# Patient Record
Sex: Female | Born: 1995 | Race: Black or African American | Hispanic: No | Marital: Single | Smoking: Former smoker
Health system: Southern US, Community
[De-identification: ages and names within clinical notes are randomized; demographics above are authoritative.]

## PROBLEM LIST (undated history)

## (undated) ENCOUNTER — Inpatient Hospital Stay (HOSPITAL_COMMUNITY): Payer: Self-pay

## (undated) DIAGNOSIS — N926 Irregular menstruation, unspecified: Secondary | ICD-10-CM

## (undated) DIAGNOSIS — F909 Attention-deficit hyperactivity disorder, unspecified type: Secondary | ICD-10-CM

## (undated) DIAGNOSIS — I10 Essential (primary) hypertension: Secondary | ICD-10-CM

## (undated) DIAGNOSIS — G43909 Migraine, unspecified, not intractable, without status migrainosus: Secondary | ICD-10-CM

## (undated) DIAGNOSIS — F419 Anxiety disorder, unspecified: Secondary | ICD-10-CM

## (undated) DIAGNOSIS — K219 Gastro-esophageal reflux disease without esophagitis: Secondary | ICD-10-CM

## (undated) DIAGNOSIS — K5900 Constipation, unspecified: Secondary | ICD-10-CM

## (undated) DIAGNOSIS — F32A Depression, unspecified: Secondary | ICD-10-CM

## (undated) DIAGNOSIS — F812 Mathematics disorder: Secondary | ICD-10-CM

## (undated) DIAGNOSIS — F329 Major depressive disorder, single episode, unspecified: Secondary | ICD-10-CM

## (undated) DIAGNOSIS — J353 Hypertrophy of tonsils with hypertrophy of adenoids: Secondary | ICD-10-CM

## (undated) DIAGNOSIS — O24419 Gestational diabetes mellitus in pregnancy, unspecified control: Secondary | ICD-10-CM

## (undated) DIAGNOSIS — D649 Anemia, unspecified: Secondary | ICD-10-CM

## (undated) HISTORY — DX: Constipation, unspecified: K59.00

## (undated) HISTORY — DX: Attention-deficit hyperactivity disorder, unspecified type: F90.9

## (undated) HISTORY — DX: Depression, unspecified: F32.A

## (undated) HISTORY — DX: Irregular menstruation, unspecified: N92.6

## (undated) HISTORY — DX: Major depressive disorder, single episode, unspecified: F32.9

---
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
## 2016-01-07 LAB — OB RESULTS CONSOLE GBS: GBS: NEGATIVE

## 2016-01-16 ENCOUNTER — Encounter (HOSPITAL_COMMUNITY): Payer: Self-pay

## 2016-01-16 ENCOUNTER — Inpatient Hospital Stay (HOSPITAL_COMMUNITY): Payer: Medicaid Other

## 2016-01-16 ENCOUNTER — Inpatient Hospital Stay (HOSPITAL_COMMUNITY)
Admission: AD | Admit: 2016-01-16 | Discharge: 2016-01-21 | DRG: 765 | Disposition: A | Discharge status: Home / Self Care | Payer: Medicaid Other | Source: Ambulatory Visit | Attending: Obstetrics and Gynecology | Admitting: Obstetrics and Gynecology

## 2016-01-16 DIAGNOSIS — Z6841 Body Mass Index (BMI) 40.0 and over, adult: Secondary | ICD-10-CM | POA: Diagnosis not present

## 2016-01-16 DIAGNOSIS — D649 Anemia, unspecified: Secondary | ICD-10-CM | POA: Diagnosis present

## 2016-01-16 DIAGNOSIS — Z8249 Family history of ischemic heart disease and other diseases of the circulatory system: Secondary | ICD-10-CM

## 2016-01-16 DIAGNOSIS — O99214 Obesity complicating childbirth: Secondary | ICD-10-CM | POA: Diagnosis present

## 2016-01-16 DIAGNOSIS — F17210 Nicotine dependence, cigarettes, uncomplicated: Secondary | ICD-10-CM | POA: Diagnosis present

## 2016-01-16 DIAGNOSIS — O329XX0 Maternal care for malpresentation of fetus, unspecified, not applicable or unspecified: Secondary | ICD-10-CM

## 2016-01-16 DIAGNOSIS — Z9119 Patient's noncompliance with other medical treatment and regimen: Secondary | ICD-10-CM

## 2016-01-16 DIAGNOSIS — Z23 Encounter for immunization: Secondary | ICD-10-CM | POA: Diagnosis not present

## 2016-01-16 DIAGNOSIS — Z825 Family history of asthma and other chronic lower respiratory diseases: Secondary | ICD-10-CM | POA: Diagnosis not present

## 2016-01-16 DIAGNOSIS — O1413 Severe pre-eclampsia, third trimester: Secondary | ICD-10-CM

## 2016-01-16 DIAGNOSIS — O1414 Severe pre-eclampsia complicating childbirth: Principal | ICD-10-CM | POA: Diagnosis present

## 2016-01-16 DIAGNOSIS — O1410 Severe pre-eclampsia, unspecified trimester: Secondary | ICD-10-CM | POA: Diagnosis present

## 2016-01-16 DIAGNOSIS — O99334 Smoking (tobacco) complicating childbirth: Secondary | ICD-10-CM | POA: Diagnosis present

## 2016-01-16 DIAGNOSIS — Z3A37 37 weeks gestation of pregnancy: Secondary | ICD-10-CM | POA: Diagnosis not present

## 2016-01-16 DIAGNOSIS — O24424 Gestational diabetes mellitus in childbirth, insulin controlled: Secondary | ICD-10-CM | POA: Diagnosis present

## 2016-01-16 DIAGNOSIS — O9902 Anemia complicating childbirth: Secondary | ICD-10-CM | POA: Diagnosis present

## 2016-01-16 DIAGNOSIS — O24419 Gestational diabetes mellitus in pregnancy, unspecified control: Secondary | ICD-10-CM

## 2016-01-16 HISTORY — DX: Severe pre-eclampsia, unspecified trimester: O14.10

## 2016-01-16 LAB — URINALYSIS, ROUTINE W REFLEX MICROSCOPIC
Bilirubin Urine: NEGATIVE
GLUCOSE, UA: NEGATIVE mg/dL
KETONES UR: NEGATIVE mg/dL
NITRITE: NEGATIVE
PH: 6.5 (ref 5.0–8.0)
Protein, ur: >300 mg/dL — AB
SPECIFIC GRAVITY, URINE: 1.015 (ref 1.005–1.030)

## 2016-01-16 LAB — COMPREHENSIVE METABOLIC PANEL
ALBUMIN: 2.3 g/dL — AB (ref 3.5–5.0)
ALT: 17 U/L (ref 14–54)
AST: 22 U/L (ref 15–41)
Alkaline Phosphatase: 124 U/L (ref 38–126)
Anion gap: 8 (ref 5–15)
BUN: 8 mg/dL (ref 6–20)
CHLORIDE: 107 mmol/L (ref 101–111)
CO2: 18 mmol/L — AB (ref 22–32)
CREATININE: 0.65 mg/dL (ref 0.44–1.00)
Calcium: 8.9 mg/dL (ref 8.9–10.3)
GFR calc Af Amer: >60 mL/min (ref >60)
Glucose, Bld: 91 mg/dL (ref 65–99)
POTASSIUM: 4.5 mmol/L (ref 3.5–5.1)
SODIUM: 133 mmol/L — AB (ref 135–145)
Total Bilirubin: 0.4 mg/dL (ref 0.3–1.2)
Total Protein: 5.8 g/dL — ABNORMAL LOW (ref 6.5–8.1)

## 2016-01-16 LAB — GLUCOSE, CAPILLARY
GLUCOSE-CAPILLARY: 77 mg/dL (ref 65–99)
GLUCOSE-CAPILLARY: 99 mg/dL (ref 65–99)

## 2016-01-16 LAB — CBC
HCT: 29.2 % — ABNORMAL LOW (ref 36.0–46.0)
HEMATOCRIT: 28.9 % — AB (ref 36.0–46.0)
Hemoglobin: 9.6 g/dL — ABNORMAL LOW (ref 12.0–15.0)
Hemoglobin: 9.7 g/dL — ABNORMAL LOW (ref 12.0–15.0)
MCH: 25.5 pg — AB (ref 26.0–34.0)
MCH: 25.7 pg — ABNORMAL LOW (ref 26.0–34.0)
MCHC: 33.2 g/dL (ref 30.0–36.0)
MCHC: 33.2 g/dL (ref 30.0–36.0)
MCV: 76.9 fL — AB (ref 78.0–100.0)
MCV: 77.2 fL — ABNORMAL LOW (ref 78.0–100.0)
Platelets: 197 10*3/uL (ref 150–400)
Platelets: 200 10*3/uL (ref 150–400)
RBC: 3.76 MIL/uL — ABNORMAL LOW (ref 3.87–5.11)
RBC: 3.78 MIL/uL — ABNORMAL LOW (ref 3.87–5.11)
RDW: 15.8 % — AB (ref 11.5–15.5)
RDW: 16.1 % — ABNORMAL HIGH (ref 11.5–15.5)
WBC: 10.5 10*3/uL (ref 4.0–10.5)
WBC: 10.9 10*3/uL — ABNORMAL HIGH (ref 4.0–10.5)

## 2016-01-16 LAB — URINE MICROSCOPIC-ADD ON

## 2016-01-16 LAB — PROTEIN / CREATININE RATIO, URINE
Creatinine, Urine: 132 mg/dL
PROTEIN CREATININE RATIO: 12.64 mg/mg{creat} — AB (ref 0.00–0.15)
TOTAL PROTEIN, URINE: 1668 mg/dL

## 2016-01-16 MED ORDER — HYDRALAZINE HCL 20 MG/ML IJ SOLN
10.0000 mg | Freq: Once | INTRAMUSCULAR | Status: DC | PRN
  Filled 2016-01-16: qty 1

## 2016-01-16 MED ORDER — HYDRALAZINE HCL 20 MG/ML IJ SOLN
10.0000 mg | Freq: Once | INTRAMUSCULAR | Status: AC | PRN
  Administered 2016-01-18: 10 mg via INTRAVENOUS

## 2016-01-16 MED ORDER — LABETALOL HCL 5 MG/ML IV SOLN
20.0000 mg | INTRAVENOUS | Status: AC | PRN
  Administered 2016-01-16: 80 mg via INTRAVENOUS
  Administered 2016-01-16: 20 mg via INTRAVENOUS
  Administered 2016-01-16: 40 mg via INTRAVENOUS
  Filled 2016-01-16: qty 8
  Filled 2016-01-16: qty 4
  Filled 2016-01-16: qty 16

## 2016-01-16 MED ORDER — SOD CITRATE-CITRIC ACID 500-334 MG/5ML PO SOLN
30.0000 mL | ORAL | Status: DC | PRN
  Filled 2016-01-16: qty 15

## 2016-01-16 MED ORDER — TERBUTALINE SULFATE 1 MG/ML IJ SOLN: 0.2500 mg | Freq: Once | INTRAMUSCULAR | Status: DC | PRN

## 2016-01-16 MED ORDER — OXYTOCIN 40 UNITS IN LACTATED RINGERS INFUSION - SIMPLE MED: 2.5000 [IU]/h | INTRAVENOUS | Status: DC

## 2016-01-16 MED ORDER — LACTATED RINGERS IV SOLN
INTRAVENOUS | Status: DC
  Administered 2016-01-16: 18:00:00 via INTRAVENOUS

## 2016-01-16 MED ORDER — OXYCODONE-ACETAMINOPHEN 5-325 MG PO TABS: 2.0000 | ORAL_TABLET | ORAL | Status: DC | PRN

## 2016-01-16 MED ORDER — ACETAMINOPHEN 325 MG PO TABS
650.0000 mg | ORAL_TABLET | ORAL | Status: DC | PRN
  Administered 2016-01-17 – 2016-01-18 (×4): 650 mg via ORAL
  Filled 2016-01-16 (×4): qty 2

## 2016-01-16 MED ORDER — FLEET ENEMA 7-19 GM/118ML RE ENEM: 1.0000 | ENEMA | RECTAL | Status: DC | PRN

## 2016-01-16 MED ORDER — ONDANSETRON HCL 4 MG/2ML IJ SOLN
4.0000 mg | Freq: Four times a day (QID) | INTRAMUSCULAR | Status: DC | PRN
Start: 2016-01-16 — End: 2016-01-19

## 2016-01-16 MED ORDER — MAGNESIUM SULFATE BOLUS VIA INFUSION
4.0000 g | Freq: Once | INTRAVENOUS | Status: AC
  Administered 2016-01-16: 4 g via INTRAVENOUS
  Filled 2016-01-16: qty 500

## 2016-01-16 MED ORDER — LACTATED RINGERS IV SOLN
2.0000 g/h | INTRAVENOUS | Status: AC
  Administered 2016-01-16 – 2016-01-19 (×4): 2 g/h via INTRAVENOUS
  Filled 2016-01-16 (×4): qty 80

## 2016-01-16 MED ORDER — MISOPROSTOL 25 MCG QUARTER TABLET
25.0000 ug | ORAL_TABLET | ORAL | Status: DC | PRN
  Administered 2016-01-16 – 2016-01-17 (×3): 25 ug via VAGINAL
  Filled 2016-01-16 (×3): qty 0.25

## 2016-01-16 MED ORDER — ACETAMINOPHEN 325 MG PO TABS
650.0000 mg | ORAL_TABLET | Freq: Once | ORAL | Status: AC
  Administered 2016-01-16: 650 mg via ORAL
  Filled 2016-01-16: qty 2

## 2016-01-16 MED ORDER — LACTATED RINGERS IV SOLN
INTRAVENOUS | Status: DC
  Administered 2016-01-16 – 2016-01-18 (×5): via INTRAVENOUS

## 2016-01-16 MED ORDER — OXYTOCIN BOLUS FROM INFUSION: 500.0000 mL | INTRAVENOUS | Status: DC

## 2016-01-16 MED ORDER — OXYCODONE-ACETAMINOPHEN 5-325 MG PO TABS
1.0000 | ORAL_TABLET | ORAL | Status: DC | PRN
  Administered 2016-01-19: 1 via ORAL
  Filled 2016-01-16: qty 1

## 2016-01-16 MED ORDER — LIDOCAINE HCL (PF) 1 % IJ SOLN: 30.0000 mL | INTRAMUSCULAR | Status: DC | PRN

## 2016-01-16 MED ORDER — LACTATED RINGERS IV SOLN: 500.0000 mL | INTRAVENOUS | Status: DC | PRN

## 2016-01-16 MED ORDER — ZOLPIDEM TARTRATE 5 MG PO TABS: 5.0000 mg | ORAL_TABLET | Freq: Every evening | ORAL | Status: DC | PRN

## 2016-01-16 MED ORDER — LABETALOL HCL 5 MG/ML IV SOLN
20.0000 mg | INTRAVENOUS | Status: DC | PRN
  Administered 2016-01-18: 20 mg via INTRAVENOUS
  Filled 2016-01-16: qty 4

## 2016-01-16 MED ORDER — FENTANYL CITRATE (PF) 100 MCG/2ML IJ SOLN
50.0000 ug | INTRAMUSCULAR | Status: DC | PRN
  Administered 2016-01-17 – 2016-01-18 (×4): 100 ug via INTRAVENOUS
  Filled 2016-01-16 (×5): qty 2

## 2016-01-16 MED ORDER — INSULIN ASPART 100 UNIT/ML ~~LOC~~ SOLN: 0.0000 [IU] | SUBCUTANEOUS | Status: DC

## 2016-01-16 NOTE — MAU Note (Signed)
CANNOT  TAKE  PT  AT THIS   TIME.

## 2016-01-16 NOTE — MAU Note (Signed)
Pt reports she was called today from her doctors office and told her urine test said she had peclampsia  And her elevated b/p on Wed. Pt c/o of slight headache, decreased fetal movement and swelling.

## 2016-01-16 NOTE — Anesthesia Pain Management Evaluation Note (Signed)
  [facility] Pain Management Visit Note  Patient: Ashley Romero, 20 y.o., female  "Hello I am a member of the anesthesia team at [facility]. We have an anesthesia team available at all times to provide care throughout the hospital, including epidural management and anesthesia for C-section. I don't know your plan for the delivery whether it a natural birth, water birth, IV sedation, nitrous supplementation, doula or epidural, but we want to meet your pain goals."   1.Was your pain managed to your expectations on prior hospitalizations?   No prior hospitalizations  2.What is your expectation for pain management during this hospitalization?     Epidural and Nitrous Oxide  3.How can we help you reach that goal? Nirtous oxide, epidural if nitrous isnt enough  Record the patient's initial score and the patient's pain goal.   Pain: 0  Pain Goal: 5 [facility] wants you to be able to say your pain was always managed very well.  Mallorie Norrod 01/16/2016

## 2016-01-16 NOTE — H&P (Signed)
Ashley Romero is a 20 y.o. female G1P0 @ [redacted]w[redacted]d presenting for Preeclampsia with severe features; specifically -headache. B/P's in severe range controlled with one dose of Labetolol in MAU.  Pregnancy followed at [facility] since [redacted]w[redacted]d  weeks and remarkable for: Preeclampsia w/ severe features; GDM-Insulin required- noncompliant. Morbid obesity   Growth u/s on 01/07/16 EFW 7lbs 8oz 96%tile AC- 98%tile. U/S done tonight with an EFW of 7 lbs.  Discussed possibility that she could have shoulder dystocia because her diabetes has not been controlled.  PCR- 12.64   Results for orders placed or performed during the hospital encounter of 01/16/16 (from the past 24 hour(s))  Urinalysis, Routine w reflex microscopic (not at [facility])     Status: Abnormal   Collection Time: 01/16/16  5:33 PM  Result Value Ref Range   Color, Urine YELLOW YELLOW   APPearance HAZY (A) CLEAR   Specific Gravity, Urine 1.015 1.005 - 1.030   pH 6.5 5.0 - 8.0   Glucose, UA NEGATIVE NEGATIVE mg/dL   Hgb urine dipstick MODERATE (A) NEGATIVE   Bilirubin Urine NEGATIVE NEGATIVE   Ketones, ur NEGATIVE NEGATIVE mg/dL   Protein, ur >161 (A) NEGATIVE mg/dL   Nitrite NEGATIVE NEGATIVE   Leukocytes, UA TRACE (A) NEGATIVE  Protein / creatinine ratio, urine     Status: Abnormal   Collection Time: 01/16/16  5:33 PM  Result Value Ref Range   Creatinine, Urine 132.00 mg/dL   Total Protein, Urine 1668 mg/dL   Protein Creatinine Ratio 12.64 (H) 0.00 - 0.15 mg/mg[Cre]  Urine microscopic-add on     Status: Abnormal   Collection Time: 01/16/16  5:33 PM  Result Value Ref Range   Squamous Epithelial / LPF 0-5 (A) NONE SEEN   WBC, UA 6-30 0 - 5 WBC/hpf   RBC / HPF 0-5 0 - 5 RBC/hpf   Bacteria, UA RARE (A) NONE SEEN  Comprehensive metabolic panel     Status: Abnormal   Collection Time: 01/16/16  6:19 PM  Result Value Ref Range   Sodium 133 (L) 135 - 145 mmol/L   Potassium 4.5 3.5 - 5.1 mmol/L   Chloride 107 101 - 111 mmol/L   CO2 18 (L) 22 -  32 mmol/L   Glucose, Bld 91 65 - 99 mg/dL   BUN 8 6 - 20 mg/dL   Creatinine, Ser 0.96 0.44 - 1.00 mg/dL   Calcium 8.9 8.9 - 04.5 mg/dL   Total Protein 5.8 (L) 6.5 - 8.1 g/dL   Albumin 2.3 (L) 3.5 - 5.0 g/dL   AST 22 15 - 41 U/L   ALT 17 14 - 54 U/L   Alkaline Phosphatase 124 38 - 126 U/L   Total Bilirubin 0.4 0.3 - 1.2 mg/dL   GFR calc non Af Amer >60 >60 mL/min   GFR calc Af Amer >60 >60 mL/min   Anion gap 8 5 - 15  CBC     Status: Abnormal   Collection Time: 01/16/16  6:19 PM  Result Value Ref Range   WBC 10.5 4.0 - 10.5 K/uL   RBC 3.76 (L) 3.87 - 5.11 MIL/uL   Hemoglobin 9.6 (L) 12.0 - 15.0 g/dL   HCT 40.9 (L) 81.1 - 91.4 %   MCV 76.9 (L) 78.0 - 100.0 fL   MCH 25.5 (L) 26.0 - 34.0 pg   MCHC 33.2 30.0 - 36.0 g/dL   RDW 78.2 (H) 95.6 - 21.3 %   Platelets 197 150 - 400 K/uL  Type and screen  Status: None   Collection Time: 01/16/16  6:19 PM  Result Value Ref Range   ABO/RH(D) O POS    Antibody Screen NEG    Sample Expiration 01/19/2016   Glucose, capillary     Status: None   Collection Time: 01/16/16  8:37 PM  Result Value Ref Range   Glucose-Capillary 77 65 - 99 mg/dL    OB History    Gravida Para Term Preterm AB TAB SAB Ectopic Multiple Living   1              Past Medical History  Diagnosis Date  . ADHD (attention deficit hyperactivity disorder)   . Depression   . Constipation   . Irregular menses     Nexplanon implant  . Migraines   . Learning disorder involving mathematics   . GERD (gastroesophageal reflux disease)     OTC as needed  . Hypertrophy of tonsils and adenoids 08/2013    snores during sleep; had sleep study 03/2013:  AHI 1.9, RDI 2.3  . Anxiety   . Gestational diabetes    Past Surgical History  Procedure Laterality Date  . Tonsillectomy and adenoidectomy Bilateral 09/25/2013    Procedure: BILATERAL TONSILLECTOMY AND ADENOIDECTOMY;  Surgeon: Darletta MollSui W Teoh, MD;  Location: [facility];  Service: ENT;  Laterality: Bilateral;  .  Wisdom tooth extraction  2015    Family History:   family history includes ADD / ADHD in her cousin; Anesthesia problems in her mother; Anxiety disorder in her mother; Asthma in her mother; Bipolar disorder in her maternal aunt; Depression in her maternal aunt and mother; Heart disease in her maternal grandmother; Hypertension in her mother; Migraines in her maternal grandmother and mother; Schizophrenia in her maternal aunt. Social History:    reports that she has quit smoking. Her smoking use included Cigarettes. She smoked 0.25 packs per day. She has never used smokeless tobacco. She reports that she does not drink alcohol or use illicit drugs.   Prenatal labs: ABO, Rh: --/--/O POS (07/20 1819) Antibody: NEG (07/20 1819) Rubella: !Error! RPR: Nonreactive (01/12 0000)  HBsAg: Negative (01/12 0000)  HIV: Non-reactive (01/12 0000)  GBS:   Negative   Prenatal Transfer Tool  Maternal Diabetes: Yes:  Diabetes Type:  Insulin/Medication controlled Genetic Screening: Normal Maternal Ultrasounds/Referrals: Normal Fetal Ultrasounds or other Referrals:  Referred to Materal Fetal Medicine  Maternal Substance Abuse:  No Significant Maternal Medications:  Meds include: Other:  Significant Maternal Lab Results: Lab values include: Other: PCR 12     Blood pressure 159/114, pulse 96, temperature 98 F (36.7 C), temperature source Oral, resp. rate 18, height 5\' 1"  (1.549 m), weight 312 lb 1.9 oz (141.577 kg), last menstrual period 05/12/2015.  General Appearance: Alert, appropriate appearance for age. No acute distress HEENT Exam: Grossly normal Chest/Respiratory Exam: Normal chest wall and respirations. Clear to auscultation Cardiovascular Exam: Regular rate and rhythm. S1, S2, no murmur Gastrointestinal Exam: soft, non-tender, Uterus gravid with size compatible with GA, Vertex presentation by Leopold's maneuvers Psychiatric Exam: Alert and oriented, appropriate affect Reflexes  2+  ++++++++++++++++++++++++++++++++++++++++++++++++++++++++++++++++  Vaginal exam: closed; confirmed vtx with u/s  Contractions irregular; mild  Fetal tracings: Category 1 . Baseline 145  ++++++++++++++++++++++++++++++++++++++++++++++++++++++++++++++++   Assessment/Plan: A1- IUP @ 37+1 A2-Primagravida A3-Morbid Obesity A4- GDM-Insulin- Non compliant; Not controlled A5- Severe Preeclampsia  P1-Admit to L/D for medical induction of labor due to Preeclampsia with severe features P2-Cytotec       Magnesium Sulfate  Labetolol Protocol       CBG's q 4 hours; Sliding scale insulin if needed       Anticipate Vaginal Delivery      Illene Bolus CNM  01/16/2016, 9:50 PM

## 2016-01-16 NOTE — MAU Provider Note (Signed)
Chief Complaint:  Hypertension   First Provider Initiated Contact with Patient 01/16/16 1759     HPI: Ashley Romero is a 20 y.o. G1P0 at [redacted]w[redacted]d who presents to maternity admissions or preeclampsia evaluation. Elevated protein creatinine ratio in the office, but normal blood pressures at regular prenatal visit yesterday. Of note patient had mildly elevated blood pressure of 140/80 at 35 weeks.  Location: Headache Quality: Pressure Severity: 5/10 in pain scale Duration: 2 days Timing: constant Modifying factors: improves w/ sleep Associated signs and symptoms: Pos for seeing black spots.   Denies contractions, leakage of fluid or vaginal bleeding. Good fetal movement.   Past Medical History: Past Medical History  Diagnosis Date  . ADHD (attention deficit hyperactivity disorder)   . Depression   . Constipation   . Irregular menses     On Nexplanon implant  . Migraines   . Learning disorder involving mathematics   . GERD (gastroesophageal reflux disease)     OTC as needed  . Hypertrophy of tonsils and adenoids 08/2013    snores during sleep; had sleep study 03/2013:  AHI 1.9, RDI 2.3  . Anxiety   . Gestational diabetes-A2(insulin)      Past obstetric history: OB History  Gravida Para Term Preterm AB SAB TAB Ectopic Multiple Living  1             # Outcome Date GA Lbr Len/2nd Weight Sex Delivery Anes PTL Lv  1 Current               Past Surgical History: Past Surgical History  Procedure Laterality Date  . Tonsillectomy and adenoidectomy Bilateral 09/25/2013    Procedure: BILATERAL TONSILLECTOMY AND ADENOIDECTOMY;  Surgeon: Darletta Moll, MD;  Location: [facility];  Service: ENT;  Laterality: Bilateral;  . Wisdom tooth extraction  2015     Family History: Family History  Problem Relation Age of Onset  . Asthma Mother   . Hypertension Mother   . Anesthesia problems Mother     hard to wake up post-op  . Migraines Mother   . Depression Mother   .  Anxiety disorder Mother   . Heart disease Maternal Grandmother   . Migraines Maternal Grandmother   . Bipolar disorder Maternal Aunt   . Schizophrenia Maternal Aunt   . Depression Maternal Aunt   . ADD / ADHD Cousin     Many paternal 1 st cousins have ADD/ADHD    Social History: Social History  Substance Use Topics  . Smoking status: Light Tobacco Smoker -- 0.25 packs/day    Types: Cigarettes  . Smokeless tobacco: Never Used     Comment: outside smokers at home  . Alcohol Use: No    Allergies:  Allergies  Allergen Reactions  . Other     Seasonal Allergies    Meds:  Prescriptions prior to admission  Medication Sig Dispense Refill Last Dose  . polyethylene glycol (MIRALAX / GLYCOLAX) packet Mix one packet in 8 oz of water and drink once daily prn constipation (Patient taking differently: Take 17 g by mouth daily as needed for moderate constipation. Mix one packet in 8 oz of water and drink once daily prn constipation) 30 each 2 11/20/2015 at Unknown time  . Prenatal Vit-Fe Fumarate-FA (PRENATAL VITAMIN) 27-0.8 MG TABS Take 1 tablet by mouth daily. 30 tablet 12 11/21/2015 at Unknown time    I have reviewed patient's Past Medical Hx, Surgical Hx, Family Hx, Social Hx, medications and allergies.  ROS:  Review of Systems  Constitutional: Negative for fever and chills.  Eyes: Positive for visual disturbance.  Gastrointestinal: Negative for abdominal pain.  Genitourinary: Negative for vaginal bleeding.  Neurological: Positive for headaches.    Physical Exam  Patient Vitals for the past 24 hrs:  BP Temp Temp src Pulse Resp Height Weight  01/16/16 1847 154/97 mmHg - - 88 - - -  01/16/16 1843 147/75 mmHg - - 87 - - -  01/16/16 1833 161/93 mmHg - - 87 - - -  01/16/16 1823 155/89 mmHg - - 95 - - -  01/16/16 1802 (!) 161/112 mmHg - - 94 - - -  01/16/16 1800 148/89 mmHg - - 93 - - -  01/16/16 1739 (!) 135/112 mmHg 98.6 F (37 C) Oral 91 18 5\' 1"  (1.549 m) (!) 312 lb 1.9 oz  (141.577 kg)   Constitutional: Well-developed, well-nourished, morbidly obese female in no acute distress.  Cardiovascular: normal rate Respiratory: normal effort GI: Abd soft, non-tender, gravid appropriate for gestational age.  MS: Extremities nontender, 3+ edema, normal ROM Neurologic: Alert and oriented x 4. 1+reflexes, 1 beat clonus bilat GU: Deferred    FHT:  Baseline 155 , min-moderate variability, accelerations present, no decelerations Contractions: irreg, mild   Labs: Results for orders placed or performed during the hospital encounter of 01/16/16 (from the past 24 hour(s))  Urinalysis, Routine w reflex microscopic (not [facility])     Status: Abnormal   Collection Time: 01/16/16  5:33 PM  Result Value Ref Range   Color, Urine YELLOW YELLOW   APPearance HAZY (A) CLEAR   Specific Gravity, Urine 1.015 1.005 - 1.030   pH 6.5 5.0 - 8.0   Glucose, UA NEGATIVE NEGATIVE mg/dL   Hgb urine dipstick MODERATE (A) NEGATIVE   Bilirubin Urine NEGATIVE NEGATIVE   Ketones, ur NEGATIVE NEGATIVE mg/dL   Protein, ur >960>300 (A) NEGATIVE mg/dL   Nitrite NEGATIVE NEGATIVE   Leukocytes, UA TRACE (A) NEGATIVE  Protein / creatinine ratio, urine     Status: Abnormal   Collection Time: 01/16/16  5:33 PM  Result Value Ref Range   Creatinine, Urine 132.00 mg/dL   Total Protein, Urine 1668 mg/dL   Protein Creatinine Ratio 12.64 (H) 0.00 - 0.15 mg/mg[Cre]  Urine microscopic-add on     Status: Abnormal   Collection Time: 01/16/16  5:33 PM  Result Value Ref Range   Squamous Epithelial / LPF 0-5 (A) NONE SEEN   WBC, UA 6-30 0 - 5 WBC/hpf   RBC / HPF 0-5 0 - 5 RBC/hpf   Bacteria, UA RARE (A) NONE SEEN  Comprehensive metabolic panel     Status: Abnormal   Collection Time: 01/16/16  6:19 PM  Result Value Ref Range   Sodium 133 (L) 135 - 145 mmol/L   Potassium 4.5 3.5 - 5.1 mmol/L   Chloride 107 101 - 111 mmol/L   CO2 18 (L) 22 - 32 mmol/L   Glucose, Bld 91 65 - 99 mg/dL   BUN 8 6 - 20 mg/dL    Creatinine, Ser 4.540.65 0.44 - 1.00 mg/dL   Calcium 8.9 8.9 - 09.810.3 mg/dL   Total Protein 5.8 (L) 6.5 - 8.1 g/dL   Albumin 2.3 (L) 3.5 - 5.0 g/dL   AST 22 15 - 41 U/L   ALT 17 14 - 54 U/L   Alkaline Phosphatase 124 38 - 126 U/L   Total Bilirubin 0.4 0.3 - 1.2 mg/dL   GFR calc non Af Amer >  60 >60 mL/min   GFR calc Af Amer >60 >60 mL/min   Anion gap 8 5 - 15  CBC     Status: Abnormal   Collection Time: 01/16/16  6:19 PM  Result Value Ref Range   WBC 10.5 4.0 - 10.5 K/uL   RBC 3.76 (L) 3.87 - 5.11 MIL/uL   Hemoglobin 9.6 (L) 12.0 - 15.0 g/dL   HCT 40.9 (L) 81.1 - 91.4 %   MCV 76.9 (L) 78.0 - 100.0 fL   MCH 25.5 (L) 26.0 - 34.0 pg   MCHC 33.2 30.0 - 36.0 g/dL   RDW 78.2 (H) 95.6 - 21.3 %   Platelets 197 150 - 400 K/uL  Type and screen     Status: None   Collection Time: 01/16/16  6:19 PM  Result Value Ref Range   ABO/RH(D) O POS    Antibody Screen NEG    Sample Expiration 01/19/2016     Imaging:  No results found.  MAU Course: Orders Placed This Encounter  Procedures  . Urinalysis, Routine w reflex microscopic (not at [facility])  . Protein / creatinine ratio, urine  . Comprehensive metabolic panel  . CBC  . RPR  . Urine microscopic-add on  . Check blood pressure 20 minutes after giving hydrALAZINE 10 mg IV dose. Call MD if SBP >/= 160 and/or DBP >/= 110.  . Once BP goal is reached, repeat BP every 10 minutes for 1 hour, then every 15 minutes for 1 hours, then per policy for antepartum labor or post-partum.  . Vital signs  . Type and screen  . ABO/Rh  . Insert peripheral IV  IV labetalol 20 mg given  MDM: - Meets criteria for preeclampsia w/ severe features based on headache, vision changes, severe-range blood pressures.  Assessment: 1. Preeclampsia, severe, third trimester    Plan: Admit to L&D for induction of labor per Dr. Estanislado Pandy.  [place], [place] 01/16/2016 7:32 PM

## 2016-01-16 NOTE — Progress Notes (Signed)
In to introduce myself to pt and evaluate. Pt comfortable and looks well. Morbidly obese with large edematous pannus.  Leopolds limited due to habitus but suspect 7 1/2-8 lbs. Ultrasound at bedside and ultrasound shows vertex infant, 6 lbs 14 oz. Discussed risk of shoulder dystocia due to uncontrolled diabetes.  Pt previous counseled on elective cesarean section if EFW 4000 gram at time of delivery.

## 2016-01-17 LAB — URIC ACID: Uric Acid, Serum: 5.9 mg/dL (ref 2.3–6.6)

## 2016-01-17 LAB — COMPREHENSIVE METABOLIC PANEL
ALK PHOS: 131 U/L — AB (ref 38–126)
ALT: 14 U/L (ref 14–54)
ALT: 19 U/L (ref 14–54)
AST: 22 U/L (ref 15–41)
AST: 25 U/L (ref 15–41)
Albumin: 2.4 g/dL — ABNORMAL LOW (ref 3.5–5.0)
Albumin: 2.6 g/dL — ABNORMAL LOW (ref 3.5–5.0)
Alkaline Phosphatase: 122 U/L (ref 38–126)
Anion gap: 8 (ref 5–15)
Anion gap: 9 (ref 5–15)
BILIRUBIN TOTAL: 0.4 mg/dL (ref 0.3–1.2)
BUN: 7 mg/dL (ref 6–20)
BUN: 9 mg/dL (ref 6–20)
CALCIUM: 8.3 mg/dL — AB (ref 8.9–10.3)
CO2: 20 mmol/L — AB (ref 22–32)
CO2: 20 mmol/L — ABNORMAL LOW (ref 22–32)
CREATININE: 0.7 mg/dL (ref 0.44–1.00)
Calcium: 8.8 mg/dL — ABNORMAL LOW (ref 8.9–10.3)
Chloride: 107 mmol/L (ref 101–111)
Chloride: 108 mmol/L (ref 101–111)
Creatinine, Ser: 0.98 mg/dL (ref 0.44–1.00)
GFR calc Af Amer: >60 mL/min (ref >60)
GFR calc non Af Amer: >60 mL/min (ref >60)
Glucose, Bld: 108 mg/dL — ABNORMAL HIGH (ref 65–99)
Glucose, Bld: 86 mg/dL (ref 65–99)
Potassium: 4.1 mmol/L (ref 3.5–5.1)
Potassium: 4.4 mmol/L (ref 3.5–5.1)
SODIUM: 136 mmol/L (ref 135–145)
Sodium: 136 mmol/L (ref 135–145)
TOTAL PROTEIN: 6.6 g/dL (ref 6.5–8.1)
Total Bilirubin: 0.3 mg/dL (ref 0.3–1.2)
Total Protein: 6.3 g/dL — ABNORMAL LOW (ref 6.5–8.1)

## 2016-01-17 LAB — GLUCOSE, CAPILLARY
GLUCOSE-CAPILLARY: 87 mg/dL (ref 65–99)
Glucose-Capillary: 99 mg/dL (ref 65–99)
Glucose-Capillary: 93 mg/dL (ref 65–99)
Glucose-Capillary: 100 mg/dL — ABNORMAL HIGH (ref 65–99)
Glucose-Capillary: 81 mg/dL (ref 65–99)

## 2016-01-17 LAB — RPR: RPR: NONREACTIVE

## 2016-01-17 LAB — CBC
HEMATOCRIT: 32.1 % — AB (ref 36.0–46.0)
HEMOGLOBIN: 10.6 g/dL — AB (ref 12.0–15.0)
MCH: 25.5 pg — AB (ref 26.0–34.0)
MCHC: 33 g/dL (ref 30.0–36.0)
MCV: 77.3 fL — AB (ref 78.0–100.0)
Platelets: 199 10*3/uL (ref 150–400)
RBC: 4.15 MIL/uL (ref 3.87–5.11)
RDW: 16.2 % — ABNORMAL HIGH (ref 11.5–15.5)
WBC: 12.2 10*3/uL — ABNORMAL HIGH (ref 4.0–10.5)

## 2016-01-17 LAB — TYPE AND SCREEN
ABO/RH(D): O POS
ABO/RH(D): O POS
ANTIBODY SCREEN: NEGATIVE
Antibody Screen: NEGATIVE

## 2016-01-17 LAB — ABO/RH: ABO/RH(D): O POS

## 2016-01-17 LAB — LACTATE DEHYDROGENASE: LDH: 202 U/L — AB (ref 98–192)

## 2016-01-17 MED ORDER — TERBUTALINE SULFATE 1 MG/ML IJ SOLN: 0.2500 mg | Freq: Once | INTRAMUSCULAR | Status: DC | PRN

## 2016-01-17 MED ORDER — OXYTOCIN 40 UNITS IN LACTATED RINGERS INFUSION - SIMPLE MED
1.0000 m[IU]/min | INTRAVENOUS | Status: DC
  Administered 2016-01-17: 1 m[IU]/min via INTRAVENOUS
  Filled 2016-01-17: qty 1000

## 2016-01-17 MED ORDER — HYDROXYZINE HCL 50 MG PO TABS
25.0000 mg | ORAL_TABLET | Freq: Four times a day (QID) | ORAL | Status: DC | PRN
  Administered 2016-01-17: 25 mg via ORAL
  Filled 2016-01-17 (×2): qty 1

## 2016-01-17 MED ORDER — MISOPROSTOL 50MCG HALF TABLET
50.0000 ug | ORAL_TABLET | ORAL | Status: DC
  Administered 2016-01-17: 50 ug via ORAL
  Filled 2016-01-17: qty 0.5
  Filled 2016-01-17 (×2): qty 1

## 2016-01-17 MED ORDER — DIPHENHYDRAMINE HCL 50 MG/ML IJ SOLN
25.0000 mg | Freq: Four times a day (QID) | INTRAMUSCULAR | Status: DC | PRN
  Administered 2016-01-17 – 2016-01-18 (×2): 25 mg via INTRAVENOUS
  Filled 2016-01-17 (×2): qty 1

## 2016-01-17 NOTE — Progress Notes (Signed)
Ashley Romero is a 20 y.o. G1P0 at 4726w2d admitted for induction secondary to severe preeclampsia  Subjective: Denies HA, visual changes or abdominal pain.  Pt feels occasional contractions.  No LOF.  Objective: BP 129/73 mmHg  Pulse 92  Temp(Src) 97.5 F (36.4 C) (Oral)  Resp 18  Ht 5\' 1"  (1.549 m)  Wt 312 lb 1.9 oz (141.577 kg)  BMI 59.01 kg/m2  SpO2 98%  LMP 05/12/2015 I/O last 3 completed shifts: In: 2020.4 [P.O.:710; I.V.:1310.4] Out: 1500 [Urine:1500] Total I/O In: 2203.3 [P.O.:1070; I.V.:1133.3] Out: 1400 [Urine:1400]  FHT:  FHR: 140s bpm, variability: moderate,  accelerations:  Present,  decelerations:  Absent UC:   irregular SVE:   Dilation: 1 Effacement (%): 30 Station: -3 Exam by:: Su Hiltoberts, MD  Labs: Lab Results  Component Value Date   WBC 10.9* 01/16/2016   HGB 9.7* 01/16/2016   HCT 29.2* 01/16/2016   MCV 77.2* 01/16/2016   PLT 200 01/16/2016   Foley balloon placed without difficulty  Assessment / Plan: Undergoing induction now with foley in place s/p cyctoec x 4  Labor: foley in place Preeclampsia:  on magnesium sulfate Fetal Wellbeing:  Category I Pain Control:  pain meds upon request I/D:  GBS neg Anticipated MOD:  NSVD   Vistaril given secondary to anxiety and itching  Anelia Carriveau Y 01/17/2016, 4:40 PM

## 2016-01-17 NOTE — Progress Notes (Addendum)
Assuming care of Ashley Romero, 20 yo G1P0 @ 37.2 wks admitted for preE w/ severe features. Support persons at bedside.  Subjective: Still reporting h/a, but improves w/ IV medication. +Scotomata. RUQ pain when lying on right side. No CP, difficulty breathing, nausea, flushing, warmth, somnolence, slurred speech or weakness. Feeling ctxs and FM. No VB or LOF.   Objective: BP 139/77 mmHg  Pulse 94  Temp(Src) 98.6 F (37 C) (Oral)  Resp 18  Ht  (1.549 m)  Wt 141.577 kg (312 lb 1.9 oz)  BMI 59.01 kg/m2  SpO2 98%  LMP 05/12/2015 I/O last 3 completed shifts: In: 4838.8 [P.O.:2020; I.V.:2818.8] Out: 3850 [Urine:3850] Total I/O In: 1421.3 [P.O.:544; I.V.:877.3] Out: 500 [Urine:500]  Today's Vitals   01/18/16 0115 01/18/16 0202 01/18/16 0238 01/18/16 0302  BP: 144/80 137/104 148/88 139/77  Pulse: 98 103 92 94  Temp:      TempSrc:      Resp: Height:      Weight:      SpO2:      PainSc:       Gen: NAD Lungs: CTAB CV: RRR w/o M/R/G Abdomen: large pannus, NTND Cephalic by Leopold's. Unable to accurately perform EFW due to habitus Ext: Calf/ankle edema, DTRs 1+ bilaterally, no clonus FHT: BL 140 w/ moderate variability, +accels, no decels UC:   irregular SVE:   Dilation: 1 Effacement (%): 30 Station: -3 Exam by:: LeeAnn Banks@ 23:48 PM  Intracervical balloon remains in situ  Results for orders placed or performed during the hospital encounter of 01/16/16 (from the past 24 hour(s))  Glucose, capillary     Status: None   Collection Time: 01/17/16  7:01 AM  Result Value Ref Range   Glucose-Capillary 99 65 - 99 mg/dL  Glucose, capillary     Status: None   Collection Time: 01/17/16 11:21 AM  Result Value Ref Range   Glucose-Capillary 93 65 - 99 mg/dL  Glucose, capillary     Status: Abnormal   Collection Time: 01/17/16  3:31 PM  Result Value Ref Range   Glucose-Capillary 100 (H) 65 - 99 mg/dL  CBC     Status: Abnormal   Collection Time: 01/17/16   7:11 PM  Result Value Ref Range   WBC 12.2 (H) 4.0 - 10.5 K/uL   RBC 4.15 3.87 - 5.11 MIL/uL   Hemoglobin 10.6 (L) 12.0 - 15.0 g/dL   HCT 16.1 (L) 09.6 - 04.5 %   MCV 77.3 (L) 78.0 - 100.0 fL   MCH 25.5 (L) 26.0 - 34.0 pg   MCHC 33.0 30.0 - 36.0 g/dL   RDW 40.9 (H) 81.1 - 91.4 %   Platelets 199 150 - 400 K/uL  Comprehensive metabolic panel     Status: Abnormal   Collection Time: 01/17/16  7:11 PM  Result Value Ref Range   Sodium 136 135 - 145 mmol/L   Potassium 4.4 3.5 - 5.1 mmol/L   Chloride 107 101 - 111 mmol/L   CO2 20 (L) 22 - 32 mmol/L   Glucose, Bld 86 65 - 99 mg/dL   BUN 7 6 - 20 mg/dL   Creatinine, Ser 7.82 0.44 - 1.00 mg/dL   Calcium 8.3 (L) 8.9 - 10.3 mg/dL   Total Protein 6.6 6.5 - 8.1 g/dL   Albumin 2.6 (L) 3.5 - 5.0 g/dL   AST 25 15 - 41 U/L   ALT 19 14 - 54 U/L   Alkaline Phosphatase 131 (H) 38 -  126 U/L   Total Bilirubin 0.4 0.3 - 1.2 mg/dL   GFR calc non Af Amer >60 >60 mL/min   GFR calc Af Amer >60 >60 mL/min   Anion gap 9 5 - 15  Uric acid     Status: None   Collection Time: 01/17/16  7:11 PM  Result Value Ref Range   Uric Acid, Serum 5.9 2.3 - 6.6 mg/dL  Lactate dehydrogenase     Status: Abnormal   Collection Time: 01/17/16  7:11 PM  Result Value Ref Range   LDH 202 (H) 98 - 192 U/L  Glucose, capillary     Status: None   Collection Time: 01/17/16  8:04 PM  Result Value Ref Range   Glucose-Capillary 81 65 - 99 mg/dL  Glucose, capillary     Status: Abnormal   Collection Time: 01/18/16 12:31 AM  Result Value Ref Range   Glucose-Capillary 120 (H) 65 - 99 mg/dL   Assessment:  IUP at 57.837.2 wks, IOL due to preE w/ severe features Cat 1 FHRT Latent labor GBS neg A2GDM - stable sugars; has not needed insulin coverage -- was noncompliant w/ Insulin during antepartum course Morbid obesity (BMI 59) ADHD (no meds) H/O depression (no meds) Bipolar I d/o (no meds)  Plan: Discussed management w/ Dr. Richardson Doppole. Will add low dose Pitocin to regimen (1x1). Risks  and benefits of Pitocin induction were reviewed, including failure of method, prolonged labor, need for further intervention and/or risk of cesarean. Patient and family seem to understand these risks and wish to proceed. Await FB expulsion.   Sherre ScarletWILLIAMS, [name] CNM 01/18/2016, 3:30 AM   ADDENDUM: Was informed by pt's nurse that pt was ambulating in hallway stating I gave her permission to do so. I did not give permission to ambulate the halls, esp given her dx. I left the pt's room with the impression that the pt and support persons understood the seriousness of her dx, hence 37 wk IOL. Reiterated  preE w/ severe features in detail. Advised to decrease stimuli. She is to remain in bed, with BR privileges and position changes as often as possible. Will order SCDs.   Sherre ScarletKimberly Shamere Campas, CNM 01/18/16, 2:30 AM

## 2016-01-18 ENCOUNTER — Inpatient Hospital Stay (HOSPITAL_COMMUNITY): Payer: Medicaid Other | Admitting: Anesthesiology

## 2016-01-18 ENCOUNTER — Encounter (HOSPITAL_COMMUNITY): Payer: Self-pay

## 2016-01-18 ENCOUNTER — Encounter (HOSPITAL_COMMUNITY)
Admission: AD | Disposition: A | Discharge status: Home / Self Care | Payer: Self-pay | Source: Ambulatory Visit | Attending: Obstetrics and Gynecology

## 2016-01-18 LAB — GLUCOSE, CAPILLARY
GLUCOSE-CAPILLARY: 105 mg/dL — AB (ref 65–99)
GLUCOSE-CAPILLARY: 118 mg/dL — AB (ref 65–99)
GLUCOSE-CAPILLARY: 120 mg/dL — AB (ref 65–99)
Glucose-Capillary: 103 mg/dL — ABNORMAL HIGH (ref 65–99)
Glucose-Capillary: 107 mg/dL — ABNORMAL HIGH (ref 65–99)
Glucose-Capillary: 107 mg/dL — ABNORMAL HIGH (ref 65–99)

## 2016-01-18 LAB — CBC WITH DIFFERENTIAL/PLATELET
BASOS ABS: 0 10*3/uL (ref 0.0–0.1)
BASOS PCT: 0 %
BASOS PCT: 0 %
Basophils Absolute: 0 10*3/uL (ref 0.0–0.1)
EOS PCT: 1 %
Eosinophils Absolute: 0.1 10*3/uL (ref 0.0–0.7)
Eosinophils Absolute: 0.1 10*3/uL (ref 0.0–0.7)
Eosinophils Relative: 1 %
HEMATOCRIT: 30.7 % — AB (ref 36.0–46.0)
HEMATOCRIT: 29.2 % — AB (ref 36.0–46.0)
Hemoglobin: 10 g/dL — ABNORMAL LOW (ref 12.0–15.0)
Hemoglobin: 9.8 g/dL — ABNORMAL LOW (ref 12.0–15.0)
LYMPHS PCT: 15 %
Lymphocytes Relative: 17 %
Lymphs Abs: 1.5 10*3/uL (ref 0.7–4.0)
Lymphs Abs: 1.9 10*3/uL (ref 0.7–4.0)
MCH: 25.3 pg — ABNORMAL LOW (ref 26.0–34.0)
MCH: 25.9 pg — AB (ref 26.0–34.0)
MCHC: 32.6 g/dL (ref 30.0–36.0)
MCHC: 33.6 g/dL (ref 30.0–36.0)
MCV: 77.7 fL — AB (ref 78.0–100.0)
MCV: 77 fL — AB (ref 78.0–100.0)
MONO ABS: 0.5 10*3/uL (ref 0.1–1.0)
MONOS PCT: 3 %
Monocytes Absolute: 0.3 10*3/uL (ref 0.1–1.0)
Monocytes Relative: 5 %
NEUTROS ABS: 7.9 10*3/uL — AB (ref 1.7–7.7)
NEUTROS ABS: 8.6 10*3/uL — AB (ref 1.7–7.7)
NEUTROS PCT: 79 %
Neutrophils Relative %: 79 %
OTHER: 0 %
Platelets: 196 10*3/uL (ref 150–400)
Platelets: 203 10*3/uL (ref 150–400)
RBC: 3.95 MIL/uL (ref 3.87–5.11)
RBC: 3.79 MIL/uL — ABNORMAL LOW (ref 3.87–5.11)
RDW: 16.3 % — AB (ref 11.5–15.5)
RDW: 16.4 % — AB (ref 11.5–15.5)
WBC: 10 10*3/uL (ref 4.0–10.5)
WBC: 10.9 10*3/uL — ABNORMAL HIGH (ref 4.0–10.5)

## 2016-01-18 LAB — MAGNESIUM
MAGNESIUM: 5.2 mg/dL — AB (ref 1.7–2.4)
Magnesium: 5.3 mg/dL — ABNORMAL HIGH (ref 1.7–2.4)

## 2016-01-18 LAB — COMPREHENSIVE METABOLIC PANEL
ALK PHOS: 122 U/L (ref 38–126)
ALT: 18 U/L (ref 14–54)
ALT: 18 U/L (ref 14–54)
ANION GAP: 9 (ref 5–15)
AST: 25 U/L (ref 15–41)
AST: 21 U/L (ref 15–41)
Albumin: 2.4 g/dL — ABNORMAL LOW (ref 3.5–5.0)
Albumin: 2.3 g/dL — ABNORMAL LOW (ref 3.5–5.0)
Alkaline Phosphatase: 125 U/L (ref 38–126)
Anion gap: 8 (ref 5–15)
BILIRUBIN TOTAL: 0.2 mg/dL — AB (ref 0.3–1.2)
BUN: 7 mg/dL (ref 6–20)
BUN: 7 mg/dL (ref 6–20)
CALCIUM: 7.7 mg/dL — AB (ref 8.9–10.3)
CO2: 20 mmol/L — AB (ref 22–32)
CO2: 20 mmol/L — ABNORMAL LOW (ref 22–32)
Calcium: 7.7 mg/dL — ABNORMAL LOW (ref 8.9–10.3)
Chloride: 104 mmol/L (ref 101–111)
Chloride: 106 mmol/L (ref 101–111)
Creatinine, Ser: 0.74 mg/dL (ref 0.44–1.00)
Creatinine, Ser: 0.72 mg/dL (ref 0.44–1.00)
GFR calc Af Amer: >60 mL/min (ref >60)
Glucose, Bld: 109 mg/dL — ABNORMAL HIGH (ref 65–99)
Glucose, Bld: 101 mg/dL — ABNORMAL HIGH (ref 65–99)
POTASSIUM: 4.2 mmol/L (ref 3.5–5.1)
Potassium: 3.8 mmol/L (ref 3.5–5.1)
SODIUM: 133 mmol/L — AB (ref 135–145)
Sodium: 134 mmol/L — ABNORMAL LOW (ref 135–145)
TOTAL PROTEIN: 6 g/dL — AB (ref 6.5–8.1)
Total Bilirubin: 0.5 mg/dL (ref 0.3–1.2)
Total Protein: 6.3 g/dL — ABNORMAL LOW (ref 6.5–8.1)

## 2016-01-18 LAB — LACTATE DEHYDROGENASE
LDH: 210 U/L — AB (ref 98–192)
LDH: 205 U/L — AB (ref 98–192)

## 2016-01-18 SURGERY — Surgical Case
Anesthesia: Epidural | Site: Abdomen

## 2016-01-18 MED ORDER — DIPHENHYDRAMINE HCL 50 MG/ML IJ SOLN: 12.5000 mg | INTRAMUSCULAR | Status: DC | PRN

## 2016-01-18 MED ORDER — LACTATED RINGERS IV SOLN
500.0000 mL | Freq: Once | INTRAVENOUS | Status: AC
  Administered 2016-01-18: 500 mL via INTRAVENOUS

## 2016-01-18 MED ORDER — LIDOCAINE-EPINEPHRINE (PF) 2 %-1:200000 IJ SOLN
INTRAMUSCULAR | Status: DC | PRN
  Administered 2016-01-18 (×3): 5 mL via EPIDURAL

## 2016-01-18 MED ORDER — OXYTOCIN 10 UNIT/ML IJ SOLN
40.0000 [IU] | INTRAVENOUS | Status: DC | PRN
  Administered 2016-01-18: 40 [IU] via INTRAVENOUS

## 2016-01-18 MED ORDER — HYDRALAZINE HCL 20 MG/ML IJ SOLN: 5.0000 mg | INTRAMUSCULAR | Status: DC | PRN

## 2016-01-18 MED ORDER — FENTANYL CITRATE (PF) 100 MCG/2ML IJ SOLN
INTRAMUSCULAR | Status: AC
  Filled 2016-01-18: qty 2

## 2016-01-18 MED ORDER — SCOPOLAMINE 1 MG/3DAYS TD PT72
MEDICATED_PATCH | TRANSDERMAL | Status: DC | PRN
  Administered 2016-01-18: 1 via TRANSDERMAL

## 2016-01-18 MED ORDER — ONDANSETRON HCL 4 MG/2ML IJ SOLN
INTRAMUSCULAR | Status: DC | PRN
  Administered 2016-01-18: 4 mg via INTRAVENOUS

## 2016-01-18 MED ORDER — DEXTROSE 5 % IV SOLN
INTRAVENOUS | Status: AC
  Filled 2016-01-18: qty 3000

## 2016-01-18 MED ORDER — PHENYLEPHRINE 40 MCG/ML (10ML) SYRINGE FOR IV PUSH (FOR BLOOD PRESSURE SUPPORT): 80.0000 ug | PREFILLED_SYRINGE | INTRAVENOUS | Status: DC | PRN

## 2016-01-18 MED ORDER — FENTANYL CITRATE (PF) 100 MCG/2ML IJ SOLN
INTRAMUSCULAR | Status: DC | PRN
  Administered 2016-01-18: 100 ug via EPIDURAL

## 2016-01-18 MED ORDER — EPHEDRINE 5 MG/ML INJ
10.0000 mg | INTRAVENOUS | Status: DC | PRN
Start: 2016-01-18 — End: 2016-01-19

## 2016-01-18 MED ORDER — LABETALOL HCL 5 MG/ML IV SOLN: 20.0000 mg | INTRAVENOUS | Status: DC | PRN

## 2016-01-18 MED ORDER — DEXTROSE 5 % IV SOLN
3.0000 g | INTRAVENOUS | Status: DC
  Filled 2016-01-18: qty 3000

## 2016-01-18 MED ORDER — SCOPOLAMINE 1 MG/3DAYS TD PT72
MEDICATED_PATCH | TRANSDERMAL | Status: AC
  Filled 2016-01-18: qty 1

## 2016-01-18 MED ORDER — SODIUM CHLORIDE 0.9 % IV SOLN
10000.0000 ug | INTRAVENOUS | Status: DC | PRN
  Administered 2016-01-18 – 2016-01-19 (×7): 80 ug via INTRAVENOUS

## 2016-01-18 MED ORDER — OXYTOCIN 10 UNIT/ML IJ SOLN
INTRAMUSCULAR | Status: AC
  Filled 2016-01-18: qty 4

## 2016-01-18 MED ORDER — PHENYLEPHRINE 40 MCG/ML (10ML) SYRINGE FOR IV PUSH (FOR BLOOD PRESSURE SUPPORT)
80.0000 ug | PREFILLED_SYRINGE | INTRAVENOUS | Status: DC | PRN
  Filled 2016-01-18: qty 10

## 2016-01-18 MED ORDER — MORPHINE SULFATE (PF) 0.5 MG/ML IJ SOLN
INTRAMUSCULAR | Status: DC | PRN
  Administered 2016-01-18: 4 mg via EPIDURAL

## 2016-01-18 MED ORDER — ONDANSETRON HCL 4 MG/2ML IJ SOLN
INTRAMUSCULAR | Status: AC
  Filled 2016-01-18: qty 2

## 2016-01-18 MED ORDER — LACTATED RINGERS IV SOLN: 500.0000 mL | Freq: Once | INTRAVENOUS | Status: DC

## 2016-01-18 MED ORDER — MISOPROSTOL 200 MCG PO TABS
ORAL_TABLET | ORAL | Status: AC
  Filled 2016-01-18: qty 5

## 2016-01-18 MED ORDER — FENTANYL 2.5 MCG/ML BUPIVACAINE 1/10 % EPIDURAL INFUSION (WH - ANES)
14.0000 mL/h | INTRAMUSCULAR | Status: DC | PRN
  Administered 2016-01-18 (×2): 14 mL/h via EPIDURAL
  Filled 2016-01-18 (×2): qty 125

## 2016-01-18 MED ORDER — MORPHINE SULFATE (PF) 0.5 MG/ML IJ SOLN
INTRAMUSCULAR | Status: AC
  Filled 2016-01-18: qty 10

## 2016-01-18 MED ORDER — LACTATED RINGERS IV SOLN
INTRAVENOUS | Status: DC | PRN
  Administered 2016-01-18: 23:00:00 via INTRAVENOUS

## 2016-01-18 MED ORDER — OXYTOCIN 40 UNITS IN LACTATED RINGERS INFUSION - SIMPLE MED: 1.0000 m[IU]/min | INTRAVENOUS | Status: DC

## 2016-01-18 MED ORDER — DEXTROSE 5 % IV SOLN
3.0000 g | INTRAVENOUS | Status: DC | PRN
  Administered 2016-01-18: 3 g via INTRAVENOUS

## 2016-01-18 MED ORDER — EPHEDRINE 5 MG/ML INJ: 10.0000 mg | INTRAVENOUS | Status: DC | PRN

## 2016-01-18 MED ORDER — LIDOCAINE HCL (PF) 1 % IJ SOLN
INTRAMUSCULAR | Status: DC | PRN
  Administered 2016-01-18 (×2): 4 mL via EPIDURAL

## 2016-01-18 SURGICAL SUPPLY — 38 items
BENZOIN TINCTURE PRP APPL 2/3 (GAUZE/BANDAGES/DRESSINGS) ×3 IMPLANT
CHLORAPREP W/TINT 26ML (MISCELLANEOUS) ×3 IMPLANT
CLAMP CORD UMBIL (MISCELLANEOUS) IMPLANT
CLOSURE WOUND 1/2 X4 (GAUZE/BANDAGES/DRESSINGS) ×1
CLOTH BEACON ORANGE TIMEOUT ST (SAFETY) ×3 IMPLANT
DRESSING DISP NPWT PICO 4X12 (MISCELLANEOUS) ×3 IMPLANT
DRSG OPSITE POSTOP 4X10 (GAUZE/BANDAGES/DRESSINGS) IMPLANT
ELECT REM PT RETURN 9FT ADLT (ELECTROSURGICAL) ×3
ELECTRODE REM PT RTRN 9FT ADLT (ELECTROSURGICAL) ×1 IMPLANT
EXCISOR BIOPSY CONE MED FISHER (MISCELLANEOUS) ×3 IMPLANT
EXTRACTOR VACUUM KIWI (MISCELLANEOUS) IMPLANT
GLOVE BIOGEL PI IND STRL 6.5 (GLOVE) ×1 IMPLANT
GLOVE BIOGEL PI IND STRL 7.0 (GLOVE) ×2 IMPLANT
GLOVE BIOGEL PI INDICATOR 6.5 (GLOVE) ×2
GLOVE BIOGEL PI INDICATOR 7.0 (GLOVE) ×4
GLOVE ECLIPSE 6.5 STRL STRAW (GLOVE) ×3 IMPLANT
GOWN STRL REUS W/TWL LRG LVL3 (GOWN DISPOSABLE) ×9 IMPLANT
KIT ABG SYR 3ML LUER SLIP (SYRINGE) IMPLANT
LIQUID BAND (GAUZE/BANDAGES/DRESSINGS) IMPLANT
NEEDLE HYPO 25X5/8 SAFETYGLIDE (NEEDLE) IMPLANT
NS IRRIG 1000ML POUR BTL (IV SOLUTION) ×3 IMPLANT
PACK C SECTION WH (CUSTOM PROCEDURE TRAY) ×3 IMPLANT
PAD ABD 7.5X8 STRL (GAUZE/BANDAGES/DRESSINGS) IMPLANT
PAD OB MATERNITY 4.3X12.25 (PERSONAL CARE ITEMS) ×3 IMPLANT
PENCIL SMOKE EVAC W/HOLSTER (ELECTROSURGICAL) ×3 IMPLANT
RTRCTR C-SECT PINK 25CM LRG (MISCELLANEOUS) ×3 IMPLANT
STRIP CLOSURE SKIN 1/2X4 (GAUZE/BANDAGES/DRESSINGS) ×2 IMPLANT
SUT PLAIN 0 NONE (SUTURE) IMPLANT
SUT PLAIN 2 0 XLH (SUTURE) ×3 IMPLANT
SUT VIC AB 0 CT1 27 (SUTURE) ×4
SUT VIC AB 0 CT1 27XBRD ANBCTR (SUTURE) ×2 IMPLANT
SUT VIC AB 0 CTX 36 (SUTURE) ×6
SUT VIC AB 0 CTX36XBRD ANBCTRL (SUTURE) ×3 IMPLANT
SUT VIC AB 2-0 CT1 27 (SUTURE) ×2
SUT VIC AB 2-0 CT1 TAPERPNT 27 (SUTURE) ×1 IMPLANT
SUT VIC AB 4-0 KS 27 (SUTURE) ×3 IMPLANT
TOWEL OR 17X24 6PK STRL BLUE (TOWEL DISPOSABLE) ×3 IMPLANT
TRAY FOLEY CATH SILVER 14FR (SET/KITS/TRAYS/PACK) IMPLANT

## 2016-01-18 NOTE — Progress Notes (Addendum)
OB PN:  S: Pt resting comfortably, no acute complaints.  No headache or blurry vision.  No RUQ pain.  Mild contractions 5/10  O: BP 161/85 mmHg  Pulse 91  Temp(Src) 97.7 F (36.5 C) (Oral)  Resp 18  Ht  (1.549 m)  Wt 141.577 kg (312 lb 1.9 oz)  BMI 59.01 kg/m2  SpO2 98%  LMP 05/12/2015  BP range: 100-161/60-106 (last 24hr)  CV: RRR Lungs: CTAB Abd: soft, no RUQ pain Ext: 2+ bilaterally edema, no clonus, DTRs 2+  FHT: 140bpm, moderate variablity, + accels, no decels Toco: irregular SVE: deferred, last exam @ 0530: 3/50/-3  Results for orders placed or performed during the hospital encounter of 01/16/16 (from the past 24 hour(s))  Glucose, capillary     Status: None   Collection Time: 01/17/16 11:21 AM  Result Value Ref Range   Glucose-Capillary 93 65 - 99 mg/dL  Glucose, capillary     Status: Abnormal   Collection Time: 01/17/16  3:31 PM  Result Value Ref Range   Glucose-Capillary 100 (H) 65 - 99 mg/dL  CBC     Status: Abnormal   Collection Time: 01/17/16  7:11 PM  Result Value Ref Range   WBC 12.2 (H) 4.0 - 10.5 K/uL   RBC 4.15 3.87 - 5.11 MIL/uL   Hemoglobin 10.6 (L) 12.0 - 15.0 g/dL   HCT 40.9 (L) 81.1 - 91.4 %   MCV 77.3 (L) 78.0 - 100.0 fL   MCH 25.5 (L) 26.0 - 34.0 pg   MCHC 33.0 30.0 - 36.0 g/dL   RDW 78.2 (H) 95.6 - 21.3 %   Platelets 199 150 - 400 K/uL  Comprehensive metabolic panel     Status: Abnormal   Collection Time: 01/17/16  7:11 PM  Result Value Ref Range   Sodium 136 135 - 145 mmol/L   Potassium 4.4 3.5 - 5.1 mmol/L   Chloride 107 101 - 111 mmol/L   CO2 20 (L) 22 - 32 mmol/L   Glucose, Bld 86 65 - 99 mg/dL   BUN 7 6 - 20 mg/dL   Creatinine, Ser 0.86 0.44 - 1.00 mg/dL   Calcium 8.3 (L) 8.9 - 10.3 mg/dL   Total Protein 6.6 6.5 - 8.1 g/dL   Albumin 2.6 (L) 3.5 - 5.0 g/dL   AST 25 15 - 41 U/L   ALT 19 14 - 54 U/L   Alkaline Phosphatase 131 (H) 38 - 126 U/L   Total Bilirubin 0.4 0.3 - 1.2 mg/dL   GFR calc non Af Amer >60 >60 mL/min   GFR calc Af Amer >60 >60 mL/min   Anion gap 9 5 - 15  Uric acid     Status: None   Collection Time: 01/17/16  7:11 PM  Result Value Ref Range   Uric Acid, Serum 5.9 2.3 - 6.6 mg/dL  Lactate dehydrogenase     Status: Abnormal   Collection Time: 01/17/16  7:11 PM  Result Value Ref Range   LDH 202 (H) 98 - 192 U/L  Glucose, capillary     Status: None   Collection Time: 01/17/16  8:04 PM  Result Value Ref Range   Glucose-Capillary 81 65 - 99 mg/dL  Glucose, capillary     Status: Abnormal   Collection Time: 01/18/16 12:31 AM  Result Value Ref Range   Glucose-Capillary 120 (H) 65 - 99 mg/dL  Glucose, capillary     Status: Abnormal   Collection Time: 01/18/16  4:29 AM  Result Value Ref  Range   Glucose-Capillary 103 (H) 65 - 99 mg/dL  Comprehensive metabolic panel     Status: Abnormal   Collection Time: 01/18/16  7:22 AM  Result Value Ref Range   Sodium 133 (L) 135 - 145 mmol/L   Potassium 3.8 3.5 - 5.1 mmol/L   Chloride 104 101 - 111 mmol/L   CO2 20 (L) 22 - 32 mmol/L   Glucose, Bld 109 (H) 65 - 99 mg/dL   BUN 7 6 - 20 mg/dL   Creatinine, Ser 7.61 0.44 - 1.00 mg/dL   Calcium 7.7 (L) 8.9 - 10.3 mg/dL   Total Protein 6.3 (L) 6.5 - 8.1 g/dL   Albumin 2.4 (L) 3.5 - 5.0 g/dL   AST 25 15 - 41 U/L   ALT 18 14 - 54 U/L   Alkaline Phosphatase 122 38 - 126 U/L   Total Bilirubin 0.5 0.3 - 1.2 mg/dL   GFR calc non Af Amer >60 >60 mL/min   GFR calc Af Amer >60 >60 mL/min   Anion gap 9 5 - 15  Lactate dehydrogenase     Status: Abnormal   Collection Time: 01/18/16  7:22 AM  Result Value Ref Range   LDH 210 (H) 98 - 192 U/L  Magnesium     Status: Abnormal   Collection Time: 01/18/16  7:22 AM  Result Value Ref Range   Magnesium 5.3 (H) 1.7 - 2.4 mg/dL     A/P: 20 y.o. Y0V3 @ [redacted]w[redacted]d for IOL due to severe preeclampsia 1. FWB: Cat. I 2. Labor: continue with Pit per protocol, plan for AROM once favorable cervix Pain: Stadol or epidural upon maternal request GBS: negative Severe  preeclampsia:   -BP as above, IV medication per protocol to keep BP <160/110  -labs remain stable, will continue to monitor q12hr  -continue IV Magnesium, level within appropriate range this am GDMA2- noncompliant  -accucheck within normal limits  -sliding scale insulin as needed Obesity: BMI- 59  -SCDs ordered    Myna Hidalgo, DO [phone] (pager) [phone] (office)

## 2016-01-18 NOTE — Progress Notes (Signed)
  Subjective: Denies h/a, visual changes, epigastric pain or difficulty breathing. Comfortable w/ epidural.   Objective: BP 137/80 mmHg  Pulse 94  Temp(Src) 98.2 F (36.8 C) (Oral)  Resp 20  Ht 5\' 1"  (1.549 m)  Wt 141.577 kg (312 lb 1.9 oz)  BMI 59.01 kg/m2  SpO2 98%  LMP 05/12/2015 I/O last 3 completed shifts: In: 7407.8 [P.O.:3536; I.V.:3871.8] Out: 3875 [Urine:3875] Today's Vitals   01/18/16 1802 01/18/16 1810 01/18/16 1832 01/18/16 1902  BP: 125/65  132/67 137/80  Pulse: 100  99 94  Temp:   98.2 F (36.8 C)   TempSrc:   Oral   Resp:   20   Height:      Weight:      SpO2:      PainSc:  8      Received one dose of IV Labetalol at 08:47 AM today   FHT: BL 140 w/ moderate variability, +accels, +mild, non-persistent variables UC:   irregular, every 2-4 minutes, MVUs 165, adequate prior SVE:   Dilation: 5 Effacement (%): 90 Station: -1 Exam by:: patti moore rn@ 18:32 Pitocin at 24 mU/min Mag continuous at 2 g/hr  Assessment:  IUP at 37.3 wks IOL due to preE w/ severe features AROM x 8 hrs; no s/s of infection Latent labor GBS neg A2GDM - stable sugars - no insulin requirement  Plan: Consider amnioinfusion if variables become persistent Continue induction  Sherre Scarlet CNM 01/18/2016, 7:30 PM

## 2016-01-18 NOTE — Anesthesia Procedure Notes (Signed)
Epidural Patient location during procedure: OB Start time: 01/18/2016 12:48 PM End time: 01/18/2016 1:00 PM  Staffing Anesthesiologist: Shona Simpson D Performed by: anesthesiologist   Preanesthetic Checklist Completed: patient identified, site marked, surgical consent, pre-op evaluation, timeout performed, IV checked, risks and benefits discussed and monitors and equipment checked  Epidural Patient position: sitting Prep: ChloraPrep Patient monitoring: heart rate, continuous pulse ox and blood pressure Approach: midline Location: L3-L4 Injection technique: LOR saline  Needle:  Needle type: Tuohy  Needle gauge: 17 G Needle length: 9 cm Catheter type: closed end flexible Catheter size: 20 Guage Test dose: negative and 1.5% lidocaine  Assessment Events: blood not aspirated, injection not painful, no injection resistance and no paresthesia  Additional Notes LOR @ 10  Patient identified. Risks/Benefits/Options discussed with patient including but not limited to bleeding, infection, nerve damage, paralysis, failed block, incomplete pain control, headache, blood pressure changes, nausea, vomiting, reactions to medications, itching and postpartum back pain. Confirmed with bedside nurse the patient's most recent platelet count. Confirmed with patient that they are not currently taking any anticoagulation, have any bleeding history or any family history of bleeding disorders. Patient expressed understanding and wished to proceed. All questions were answered. Sterile technique was used throughout the entire procedure. Please see nursing notes for vital signs. Test dose was given through epidural catheter and negative prior to continuing to dose epidural or start infusion. Warning signs of high block given to the patient including shortness of breath, tingling/numbness in hands, complete motor block, or any concerning symptoms with instructions to call for help. Patient was given instructions on  fall risk and not to get out of bed. All questions and concerns addressed with instructions to call with any issues or inadequate analgesia.    Reason for block:procedure for pain

## 2016-01-18 NOTE — Progress Notes (Addendum)
Currently on day 3 of IOL due to preE w/ severe features.  Subjective: Crying with each ctx -- on left side due to inability to get comfortable in any other position. Rates pain 10/10 despite Epidural.   Objective: BP 144/93 mmHg  Pulse 104  Temp(Src) 98.8 F (37.1 C) (Oral)  Resp 18  Ht 5\' 1"  (1.549 m)  Wt 141.577 kg (312 lb 1.9 oz)  BMI 59.01 kg/m2  SpO2 98%  LMP 05/12/2015 I/O last 3 completed shifts: In: 7407.8 [P.O.:3536; I.V.:3871.8] Out: 3875 [Urine:3875] Total I/O In: 298.8 [P.O.:75; I.V.:223.8] Out: 50 [Urine:50]  FHT: Category 1 UC:   irregular, every 4-5 minutes SVE:   Dilation: 4.5 Effacement (%): 90 Station: -1 Exam by:: Selena Batten williams CNM@ 22:42 Pitocin stopped at 22:46 PM, was up to 26 mU/min  Assessment:  FTP -- Arrest of dilation Not in labor GBS neg  Plan: Dr. Charlotta Newton updated. C-section recommended to pt and family. Risks, Benefits and Alternatives including but not limited to bleeding, infection and injury were discussed. Patient verbalized understanding and desires to proceed. Ancef 3 grams on call to OR. Informed OR of need for pannus lift.   Sherre Scarlet CNM 01/18/2016, 10:46 PM  At bedside to discuss plan for primary C-section due to arrest of dilation-Risk benefits and alternatives of cesarean section were discussed with the patient including but not limited to infection, bleeding, damage to bowel , bladder and baby with the need for further surgery. Pt voiced understanding and desires to proceed.   Myna Hidalgo, DO [phone] (pager) [phone] (office)

## 2016-01-18 NOTE — Anesthesia Preprocedure Evaluation (Signed)
Anesthesia Evaluation  Patient identified by MRN, date of birth, ID band Patient awake    Reviewed: Allergy & Precautions, NPO status , Patient's Chart, lab work & pertinent test results  Airway Mallampati: III       Dental  (+) Teeth Intact   Pulmonary sleep apnea , former smoker,    breath sounds clear to auscultation       Cardiovascular hypertension, + Peripheral Vascular Disease   Rhythm:Regular Rate:Normal     Neuro/Psych  Headaches, PSYCHIATRIC DISORDERS Anxiety Depression Bipolar Disorder    GI/Hepatic Neg liver ROS, GERD  Medicated,  Endo/Other  diabetes  Renal/GU negative Renal ROS  negative genitourinary   Musculoskeletal negative musculoskeletal ROS (+)   Abdominal   Peds negative pediatric ROS (+)  Hematology negative hematology ROS (+)   Anesthesia Other Findings   Reproductive/Obstetrics (+) Pregnancy                             Lab Results  Component Value Date   WBC 10.0 01/18/2016   HGB 10.0* 01/18/2016   HCT 30.7* 01/18/2016   MCV 77.7* 01/18/2016   PLT 196 01/18/2016   No results found for: INR, PROTIME   Anesthesia Physical Anesthesia Plan  ASA: III  Anesthesia Plan: Epidural   Post-op Pain Management:    Induction:   Airway Management Planned:   Additional Equipment:   Intra-op Plan:   Post-operative Plan:   Informed Consent: I have reviewed the patients History and Physical, chart, labs and discussed the procedure including the risks, benefits and alternatives for the proposed anesthesia with the patient or authorized representative who has indicated his/her understanding and acceptance.     Plan Discussed with:   Anesthesia Plan Comments:         Anesthesia Quick Evaluation

## 2016-01-18 NOTE — Progress Notes (Signed)
OB PN:  S: Pt now reporting return of her headache and blurry vision, but same as yesterday.    O: BP 134/73 mmHg  Pulse 90  Temp(Src) 97.7 F (36.5 C) (Oral)  Resp 18  Ht 5\' 1"  (1.549 m)  Wt 141.577 kg (312 lb 1.9 oz)  BMI 59.01 kg/m2  SpO2 98%  LMP 05/12/2015  BP range: 100-161/60-106 (last 24hr)  FHT: 135bpm, moderate variablity, + accels, no decels Toco: difficulty tracing SVE: 4/70/-3, AROM clear fluid, IUPC placed  Results for orders placed or performed during the hospital encounter of 01/16/16 (from the past 24 hour(s))  Glucose, capillary     Status: None   Collection Time: 01/17/16 11:21 AM  Result Value Ref Range   Glucose-Capillary 93 65 - 99 mg/dL  Glucose, capillary     Status: Abnormal   Collection Time: 01/17/16  3:31 PM  Result Value Ref Range   Glucose-Capillary 100 (H) 65 - 99 mg/dL  CBC     Status: Abnormal   Collection Time: 01/17/16  7:11 PM  Result Value Ref Range   WBC 12.2 (H) 4.0 - 10.5 K/uL   RBC 4.15 3.87 - 5.11 MIL/uL   Hemoglobin 10.6 (L) 12.0 - 15.0 g/dL   HCT 81.0 (L) 17.5 - 10.2 %   MCV 77.3 (L) 78.0 - 100.0 fL   MCH 25.5 (L) 26.0 - 34.0 pg   MCHC 33.0 30.0 - 36.0 g/dL   RDW 58.5 (H) 27.7 - 82.4 %   Platelets 199 150 - 400 K/uL  Comprehensive metabolic panel     Status: Abnormal   Collection Time: 01/17/16  7:11 PM  Result Value Ref Range   Sodium 136 135 - 145 mmol/L   Potassium 4.4 3.5 - 5.1 mmol/L   Chloride 107 101 - 111 mmol/L   CO2 20 (L) 22 - 32 mmol/L   Glucose, Bld 86 65 - 99 mg/dL   BUN 7 6 - 20 mg/dL   Creatinine, Ser 2.35 0.44 - 1.00 mg/dL   Calcium 8.3 (L) 8.9 - 10.3 mg/dL   Total Protein 6.6 6.5 - 8.1 g/dL   Albumin 2.6 (L) 3.5 - 5.0 g/dL   AST 25 15 - 41 U/L   ALT 19 14 - 54 U/L   Alkaline Phosphatase 131 (H) 38 - 126 U/L   Total Bilirubin 0.4 0.3 - 1.2 mg/dL   GFR calc non Af Amer >60 >60 mL/min   GFR calc Af Amer >60 >60 mL/min   Anion gap 9 5 - 15  Uric acid     Status: None   Collection Time: 01/17/16   7:11 PM  Result Value Ref Range   Uric Acid, Serum 5.9 2.3 - 6.6 mg/dL  Lactate dehydrogenase     Status: Abnormal   Collection Time: 01/17/16  7:11 PM  Result Value Ref Range   LDH 202 (H) 98 - 192 U/L  Glucose, capillary     Status: None   Collection Time: 01/17/16  8:04 PM  Result Value Ref Range   Glucose-Capillary 81 65 - 99 mg/dL  Glucose, capillary     Status: Abnormal   Collection Time: 01/18/16 12:31 AM  Result Value Ref Range   Glucose-Capillary 120 (H) 65 - 99 mg/dL  Glucose, capillary     Status: Abnormal   Collection Time: 01/18/16  4:29 AM  Result Value Ref Range   Glucose-Capillary 103 (H) 65 - 99 mg/dL  Comprehensive metabolic panel     Status: Abnormal  Collection Time: 01/18/16  7:22 AM  Result Value Ref Range   Sodium 133 (L) 135 - 145 mmol/L   Potassium 3.8 3.5 - 5.1 mmol/L   Chloride 104 101 - 111 mmol/L   CO2 20 (L) 22 - 32 mmol/L   Glucose, Bld 109 (H) 65 - 99 mg/dL   BUN 7 6 - 20 mg/dL   Creatinine, Ser 1.61 0.44 - 1.00 mg/dL   Calcium 7.7 (L) 8.9 - 10.3 mg/dL   Total Protein 6.3 (L) 6.5 - 8.1 g/dL   Albumin 2.4 (L) 3.5 - 5.0 g/dL   AST 25 15 - 41 U/L   ALT 18 14 - 54 U/L   Alkaline Phosphatase 122 38 - 126 U/L   Total Bilirubin 0.5 0.3 - 1.2 mg/dL   GFR calc non Af Amer >60 >60 mL/min   GFR calc Af Amer >60 >60 mL/min   Anion gap 9 5 - 15  Lactate dehydrogenase     Status: Abnormal   Collection Time: 01/18/16  7:22 AM  Result Value Ref Range   LDH 210 (H) 98 - 192 U/L  Magnesium     Status: Abnormal   Collection Time: 01/18/16  7:22 AM  Result Value Ref Range   Magnesium 5.3 (H) 1.7 - 2.4 mg/dL  Glucose, capillary     Status: Abnormal   Collection Time: 01/18/16  8:33 AM  Result Value Ref Range   Glucose-Capillary 107 (H) 65 - 99 mg/dL    A/P: 20 y.o. W9U0 @ 103w3d for IOL due to severe preeclampsia 1. FWB: Cat. I 2. Labor: continue with Pit per protocol, IUPC to monitor contractions Pain: Stadol or epidural upon maternal request GBS:  negative Severe preeclampsia:   -Pt asymptomatic  -BP as above, s/p IV Labetalol x 1, continue to monitor and keep BP <160/110  -labs remain stable, will continue to monitor q12hr  -continue IV Magnesium, level within appropriate range this am GDMA2- noncompliant  -accucheck within normal limits since arrival  -sliding scale insulin as needed Obesity: BMI- 59  -SCDs ordered    Myna Hidalgo, DO [phone] (pager) [phone] (office)

## 2016-01-18 NOTE — Progress Notes (Signed)
OB PN:  S: Pt resting comfortably with epidural.    O: BP 138/88 mmHg  Pulse 89  Temp(Src) 98.2 F (36.8 C) (Axillary)  Resp 20  Ht  (1.549 m)  Wt 141.577 kg (312 lb 1.9 oz)  BMI 59.01 kg/m2  SpO2 98%  LMP 05/12/2015  BP range: 112-161/49-109 (since 7am)  FHT: 135bpm, moderate variablity, + accels, no decels Toco: q3-10min, MVU ~180 SVE: 4/70/-3, IUPC in place  Results for orders placed or performed during the hospital encounter of 01/16/16 (from the past 24 hour(s))  Glucose, capillary     Status: Abnormal   Collection Time: 01/17/16  3:31 PM  Result Value Ref Range   Glucose-Capillary 100 (H) 65 - 99 mg/dL  CBC     Status: Abnormal   Collection Time: 01/17/16  7:11 PM  Result Value Ref Range   WBC 12.2 (H) 4.0 - 10.5 K/uL   RBC 4.15 3.87 - 5.11 MIL/uL   Hemoglobin 10.6 (L) 12.0 - 15.0 g/dL   HCT 09.8 (L) 11.9 - 14.7 %   MCV 77.3 (L) 78.0 - 100.0 fL   MCH 25.5 (L) 26.0 - 34.0 pg   MCHC 33.0 30.0 - 36.0 g/dL   RDW 82.9 (H) 56.2 - 13.0 %   Platelets 199 150 - 400 K/uL  Comprehensive metabolic panel     Status: Abnormal   Collection Time: 01/17/16  7:11 PM  Result Value Ref Range   Sodium 136 135 - 145 mmol/L   Potassium 4.4 3.5 - 5.1 mmol/L   Chloride 107 101 - 111 mmol/L   CO2 20 (L) 22 - 32 mmol/L   Glucose, Bld 86 65 - 99 mg/dL   BUN 7 6 - 20 mg/dL   Creatinine, Ser 8.65 0.44 - 1.00 mg/dL   Calcium 8.3 (L) 8.9 - 10.3 mg/dL   Total Protein 6.6 6.5 - 8.1 g/dL   Albumin 2.6 (L) 3.5 - 5.0 g/dL   AST 25 15 - 41 U/L   ALT 19 14 - 54 U/L   Alkaline Phosphatase 131 (H) 38 - 126 U/L   Total Bilirubin 0.4 0.3 - 1.2 mg/dL   GFR calc non Af Amer >60 >60 mL/min   GFR calc Af Amer >60 >60 mL/min   Anion gap 9 5 - 15  Uric acid     Status: None   Collection Time: 01/17/16  7:11 PM  Result Value Ref Range   Uric Acid, Serum 5.9 2.3 - 6.6 mg/dL  Lactate dehydrogenase     Status: Abnormal   Collection Time: 01/17/16  7:11 PM  Result Value Ref Range   LDH 202 (H)  98 - 192 U/L  Glucose, capillary     Status: None   Collection Time: 01/17/16  8:04 PM  Result Value Ref Range   Glucose-Capillary 81 65 - 99 mg/dL  Glucose, capillary     Status: Abnormal   Collection Time: 01/18/16 12:31 AM  Result Value Ref Range   Glucose-Capillary 120 (H) 65 - 99 mg/dL  Glucose, capillary     Status: Abnormal   Collection Time: 01/18/16  4:29 AM  Result Value Ref Range   Glucose-Capillary 103 (H) 65 - 99 mg/dL  CBC with Differential/Platelet     Status: Abnormal   Collection Time: 01/18/16  7:22 AM  Result Value Ref Range   WBC 10.0 4.0 - 10.5 K/uL   RBC 3.95 3.87 - 5.11 MIL/uL   Hemoglobin 10.0 (L) 12.0 - 15.0 g/dL  HCT 30.7 (L) 36.0 - 46.0 %   MCV 77.7 (L) 78.0 - 100.0 fL   MCH 25.3 (L) 26.0 - 34.0 pg   MCHC 32.6 30.0 - 36.0 g/dL   RDW 27.0 (H) 78.6 - 75.4 %   Platelets 196 150 - 400 K/uL   Neutrophils Relative % 79 %   Neutro Abs 7.9 (H) 1.7 - 7.7 K/uL   Lymphocytes Relative 15 %   Lymphs Abs 1.5 0.7 - 4.0 K/uL   Monocytes Relative 5 %   Monocytes Absolute 0.5 0.1 - 1.0 K/uL   Eosinophils Relative 1 %   Eosinophils Absolute 0.1 0.0 - 0.7 K/uL   Basophils Relative 0 %   Basophils Absolute 0.0 0.0 - 0.1 K/uL  Comprehensive metabolic panel     Status: Abnormal   Collection Time: 01/18/16  7:22 AM  Result Value Ref Range   Sodium 133 (L) 135 - 145 mmol/L   Potassium 3.8 3.5 - 5.1 mmol/L   Chloride 104 101 - 111 mmol/L   CO2 20 (L) 22 - 32 mmol/L   Glucose, Bld 109 (H) 65 - 99 mg/dL   BUN 7 6 - 20 mg/dL   Creatinine, Ser 4.92 0.44 - 1.00 mg/dL   Calcium 7.7 (L) 8.9 - 10.3 mg/dL   Total Protein 6.3 (L) 6.5 - 8.1 g/dL   Albumin 2.4 (L) 3.5 - 5.0 g/dL   AST 25 15 - 41 U/L   ALT 18 14 - 54 U/L   Alkaline Phosphatase 122 38 - 126 U/L   Total Bilirubin 0.5 0.3 - 1.2 mg/dL   GFR calc non Af Amer >60 >60 mL/min   GFR calc Af Amer >60 >60 mL/min   Anion gap 9 5 - 15  Lactate dehydrogenase     Status: Abnormal   Collection Time: 01/18/16  7:22 AM   Result Value Ref Range   LDH 210 (H) 98 - 192 U/L  Magnesium     Status: Abnormal   Collection Time: 01/18/16  7:22 AM  Result Value Ref Range   Magnesium 5.3 (H) 1.7 - 2.4 mg/dL  Glucose, capillary     Status: Abnormal   Collection Time: 01/18/16  8:33 AM  Result Value Ref Range   Glucose-Capillary 107 (H) 65 - 99 mg/dL  Glucose, capillary     Status: Abnormal   Collection Time: 01/18/16  1:11 PM  Result Value Ref Range   Glucose-Capillary 105 (H) 65 - 99 mg/dL    A/P: 20 y.o. E1E0 @ [redacted]w[redacted]d for IOL due to severe preeclampsia 1. FWB: Cat. I 2. Labor: continue with Pit per protocol and work on obtained MVUs ~ 200 Pain: continue epidural GBS: negative Severe preeclampsia:   -BP as above, s/p IV Labetalol x 1, continue to monitor and keep BP <160/110  -labs remain stable, will continue to monitor q12hr  -continue IV Magnesium, level within appropriate range this am GDMA2- noncompliant  -accucheck within normal limits since arrival  -sliding scale insulin as needed Obesity: BMI- 59  -SCDs ordered   Myna Hidalgo, DO [phone] (pager) [phone] (office)

## 2016-01-19 ENCOUNTER — Encounter (HOSPITAL_COMMUNITY): Payer: Self-pay

## 2016-01-19 LAB — COMPREHENSIVE METABOLIC PANEL
ALBUMIN: 2 g/dL — AB (ref 3.5–5.0)
ALT: 14 U/L (ref 14–54)
AST: 28 U/L (ref 15–41)
Alkaline Phosphatase: 109 U/L (ref 38–126)
Anion gap: 8 (ref 5–15)
BUN: 9 mg/dL (ref 6–20)
CHLORIDE: 105 mmol/L (ref 101–111)
CO2: 21 mmol/L — AB (ref 22–32)
CREATININE: 0.82 mg/dL (ref 0.44–1.00)
Calcium: 7.4 mg/dL — ABNORMAL LOW (ref 8.9–10.3)
GFR calc Af Amer: >60 mL/min (ref >60)
GFR calc non Af Amer: >60 mL/min (ref >60)
GLUCOSE: 132 mg/dL — AB (ref 65–99)
Potassium: 4.8 mmol/L (ref 3.5–5.1)
SODIUM: 134 mmol/L — AB (ref 135–145)
Total Bilirubin: 0.6 mg/dL (ref 0.3–1.2)
Total Protein: 5.7 g/dL — ABNORMAL LOW (ref 6.5–8.1)

## 2016-01-19 LAB — MAGNESIUM: Magnesium: 5.6 mg/dL — ABNORMAL HIGH (ref 1.7–2.4)

## 2016-01-19 LAB — CBC
HEMATOCRIT: 30.2 % — AB (ref 36.0–46.0)
Hemoglobin: 10 g/dL — ABNORMAL LOW (ref 12.0–15.0)
MCH: 25.5 pg — ABNORMAL LOW (ref 26.0–34.0)
MCHC: 33.1 g/dL (ref 30.0–36.0)
MCV: 77 fL — ABNORMAL LOW (ref 78.0–100.0)
PLATELETS: 206 10*3/uL (ref 150–400)
RBC: 3.92 MIL/uL (ref 3.87–5.11)
RDW: 16.6 % — AB (ref 11.5–15.5)
WBC: 17 10*3/uL — AB (ref 4.0–10.5)

## 2016-01-19 LAB — GLUCOSE, CAPILLARY: GLUCOSE-CAPILLARY: 120 mg/dL — AB (ref 65–99)

## 2016-01-19 MED ORDER — SIMETHICONE 80 MG PO CHEW
80.0000 mg | CHEWABLE_TABLET | Freq: Three times a day (TID) | ORAL | Status: DC
  Administered 2016-01-20 – 2016-01-21 (×4): 80 mg via ORAL
  Filled 2016-01-19 (×4): qty 1

## 2016-01-19 MED ORDER — DIPHENHYDRAMINE HCL 25 MG PO CAPS
25.0000 mg | ORAL_CAPSULE | ORAL | Status: DC | PRN
Start: 2016-01-19 — End: 2016-01-19

## 2016-01-19 MED ORDER — SIMETHICONE 80 MG PO CHEW
80.0000 mg | CHEWABLE_TABLET | ORAL | Status: DC
  Administered 2016-01-19 – 2016-01-20 (×2): 80 mg via ORAL
  Filled 2016-01-19 (×2): qty 1

## 2016-01-19 MED ORDER — PRENATAL MULTIVITAMIN CH
1.0000 | ORAL_TABLET | Freq: Every day | ORAL | Status: DC
  Administered 2016-01-20 – 2016-01-21 (×2): 1 via ORAL
  Filled 2016-01-19 (×2): qty 1

## 2016-01-19 MED ORDER — NALBUPHINE HCL 10 MG/ML IJ SOLN: 5.0000 mg | INTRAMUSCULAR | Status: DC | PRN

## 2016-01-19 MED ORDER — LABETALOL HCL 5 MG/ML IV SOLN
INTRAVENOUS | Status: AC
  Administered 2016-01-19: 02:00:00
  Filled 2016-01-19: qty 4

## 2016-01-19 MED ORDER — DIPHENHYDRAMINE HCL 25 MG PO CAPS
25.0000 mg | ORAL_CAPSULE | Freq: Four times a day (QID) | ORAL | Status: DC | PRN
  Administered 2016-01-19 – 2016-01-20 (×3): 25 mg via ORAL
  Filled 2016-01-19 (×3): qty 1

## 2016-01-19 MED ORDER — SCOPOLAMINE 1 MG/3DAYS TD PT72
1.0000 | MEDICATED_PATCH | Freq: Once | TRANSDERMAL | Status: DC
  Filled 2016-01-19: qty 1

## 2016-01-19 MED ORDER — LABETALOL HCL 5 MG/ML IV SOLN: 20.0000 mg | INTRAVENOUS | Status: DC | PRN

## 2016-01-19 MED ORDER — NALOXONE HCL 0.4 MG/ML IJ SOLN: 0.4000 mg | INTRAMUSCULAR | Status: DC | PRN

## 2016-01-19 MED ORDER — FENTANYL CITRATE (PF) 100 MCG/2ML IJ SOLN
INTRAMUSCULAR | Status: AC
  Administered 2016-01-19: 02:00:00
  Filled 2016-01-19: qty 2

## 2016-01-19 MED ORDER — ACETAMINOPHEN 325 MG PO TABS
650.0000 mg | ORAL_TABLET | ORAL | Status: DC | PRN
  Administered 2016-01-20: 650 mg via ORAL
  Filled 2016-01-19: qty 2

## 2016-01-19 MED ORDER — DIBUCAINE 1 % RE OINT: 1.0000 "application " | TOPICAL_OINTMENT | RECTAL | Status: DC | PRN

## 2016-01-19 MED ORDER — DIPHENHYDRAMINE HCL 50 MG/ML IJ SOLN: 12.5000 mg | INTRAMUSCULAR | Status: DC | PRN

## 2016-01-19 MED ORDER — IBUPROFEN 600 MG PO TABS
600.0000 mg | ORAL_TABLET | Freq: Four times a day (QID) | ORAL | Status: DC
  Administered 2016-01-19 – 2016-01-21 (×9): 600 mg via ORAL
  Filled 2016-01-19 (×9): qty 1

## 2016-01-19 MED ORDER — NALOXONE HCL 2 MG/2ML IJ SOSY
1.0000 ug/kg/h | PREFILLED_SYRINGE | INTRAMUSCULAR | Status: DC | PRN
  Filled 2016-01-19: qty 2

## 2016-01-19 MED ORDER — INSULIN ASPART 100 UNIT/ML ~~LOC~~ SOLN
0.0000 [IU] | SUBCUTANEOUS | Status: DC | PRN
  Administered 2016-01-19 – 2016-01-21 (×6): 1 [IU] via SUBCUTANEOUS
  Filled 2016-01-19 (×6): qty 0.14

## 2016-01-19 MED ORDER — ZOLPIDEM TARTRATE 5 MG PO TABS: 5.0000 mg | ORAL_TABLET | Freq: Every evening | ORAL | Status: DC | PRN

## 2016-01-19 MED ORDER — NALBUPHINE HCL 10 MG/ML IJ SOLN: 5.0000 mg | Freq: Once | INTRAMUSCULAR | Status: DC | PRN

## 2016-01-19 MED ORDER — KETOROLAC TROMETHAMINE 30 MG/ML IJ SOLN
30.0000 mg | Freq: Four times a day (QID) | INTRAMUSCULAR | Status: DC | PRN
  Filled 2016-01-19: qty 1

## 2016-01-19 MED ORDER — OXYTOCIN 40 UNITS IN LACTATED RINGERS INFUSION - SIMPLE MED: 2.5000 [IU]/h | INTRAVENOUS | Status: AC

## 2016-01-19 MED ORDER — SIMETHICONE 80 MG PO CHEW: 80.0000 mg | CHEWABLE_TABLET | ORAL | Status: DC | PRN

## 2016-01-19 MED ORDER — LACTATED RINGERS IV SOLN: INTRAVENOUS | Status: DC

## 2016-01-19 MED ORDER — METOCLOPRAMIDE HCL 5 MG/ML IJ SOLN
INTRAMUSCULAR | Status: AC
  Filled 2016-01-19: qty 2

## 2016-01-19 MED ORDER — ONDANSETRON HCL 4 MG/2ML IJ SOLN: 4.0000 mg | Freq: Three times a day (TID) | INTRAMUSCULAR | Status: DC | PRN

## 2016-01-19 MED ORDER — MEPERIDINE HCL 25 MG/ML IJ SOLN: 6.2500 mg | INTRAMUSCULAR | Status: DC | PRN

## 2016-01-19 MED ORDER — ENOXAPARIN SODIUM 40 MG/0.4ML ~~LOC~~ SOLN
40.0000 mg | SUBCUTANEOUS | Status: DC
  Administered 2016-01-19 – 2016-01-20 (×2): 40 mg via SUBCUTANEOUS
  Filled 2016-01-19 (×3): qty 0.4

## 2016-01-19 MED ORDER — COCONUT OIL OIL
1.0000 "application " | TOPICAL_OIL | Status: DC | PRN
  Administered 2016-01-19: 1 via TOPICAL
  Filled 2016-01-19: qty 120

## 2016-01-19 MED ORDER — FENTANYL CITRATE (PF) 100 MCG/2ML IJ SOLN: 25.0000 ug | INTRAMUSCULAR | Status: DC | PRN

## 2016-01-19 MED ORDER — HYDRALAZINE HCL 20 MG/ML IJ SOLN: 5.0000 mg | INTRAMUSCULAR | Status: DC | PRN

## 2016-01-19 MED ORDER — SODIUM CHLORIDE 0.9% FLUSH: 3.0000 mL | INTRAVENOUS | Status: DC | PRN

## 2016-01-19 MED ORDER — LACTATED RINGERS IV SOLN
INTRAVENOUS | Status: DC
  Administered 2016-01-19: 11:00:00 via INTRAVENOUS

## 2016-01-19 MED ORDER — MEPERIDINE HCL 25 MG/ML IJ SOLN
INTRAMUSCULAR | Status: AC
  Filled 2016-01-19: qty 1

## 2016-01-19 MED ORDER — INSULIN ASPART 100 UNIT/ML ~~LOC~~ SOLN: 0.0000 [IU] | Freq: Three times a day (TID) | SUBCUTANEOUS | Status: DC

## 2016-01-19 MED ORDER — MENTHOL 3 MG MT LOZG
1.0000 | LOZENGE | OROMUCOSAL | Status: DC | PRN
  Filled 2016-01-19: qty 9

## 2016-01-19 MED ORDER — SODIUM CHLORIDE 0.9 % IR SOLN
Status: DC | PRN
  Administered 2016-01-19 (×2): 1

## 2016-01-19 MED ORDER — METOCLOPRAMIDE HCL 5 MG/ML IJ SOLN
INTRAMUSCULAR | Status: DC | PRN
  Administered 2016-01-19: 10 mg via INTRAVENOUS

## 2016-01-19 MED ORDER — WITCH HAZEL-GLYCERIN EX PADS: 1.0000 "application " | MEDICATED_PAD | CUTANEOUS | Status: DC | PRN

## 2016-01-19 MED ORDER — SENNOSIDES-DOCUSATE SODIUM 8.6-50 MG PO TABS
2.0000 | ORAL_TABLET | ORAL | Status: DC
  Administered 2016-01-19 – 2016-01-20 (×2): 2 via ORAL
  Filled 2016-01-19 (×2): qty 2

## 2016-01-19 MED ORDER — PROMETHAZINE HCL 25 MG/ML IJ SOLN: 6.2500 mg | INTRAMUSCULAR | Status: DC | PRN

## 2016-01-19 MED ORDER — MEPERIDINE HCL 25 MG/ML IJ SOLN
INTRAMUSCULAR | Status: DC | PRN
  Administered 2016-01-19: 12.5 mg via INTRAVENOUS

## 2016-01-19 MED ORDER — OXYCODONE HCL 5 MG PO TABS
5.0000 mg | ORAL_TABLET | ORAL | Status: DC | PRN
  Administered 2016-01-19 – 2016-01-21 (×5): 5 mg via ORAL
  Filled 2016-01-19 (×4): qty 1

## 2016-01-19 MED ORDER — TETANUS-DIPHTH-ACELL PERTUSSIS 5-2.5-18.5 LF-MCG/0.5 IM SUSP
0.5000 mL | Freq: Once | INTRAMUSCULAR | Status: AC
  Administered 2016-01-21: 0.5 mL via INTRAMUSCULAR
  Filled 2016-01-19 (×2): qty 0.5

## 2016-01-19 NOTE — Lactation Note (Signed)
This note was copied from a baby's chart. Lactation Consultation Note  Patient Name: Ashley Romero YBWLS'L Date: 01/19/2016 Reason for consult: Initial assessment Infant is 22 hours old & seen by [facility] for initial assessment. Mom has h/o GDM, PCOS, is on Magnesium. Baby was born at [redacted]w[redacted]d & weighed 8+15.7# at birth. Baby has had 1 stool diaper & 0 wet. Baby has received formula 5x since birth via syringe (10-15 mL each time). Mom was asleep when LC entered but grandma was there & stated she had just finger/ syringe fed infant ~31ml of Alimentum formula. Grandma stated that mom had tried pumping but did not get any milk. Grandma asked about a pump & stated that mom has WIC; discussed WIC pumps and told grandma that I would send a referral to [facility].  2:20pm- LC came back to get information to fax [facility] referral & mom was awake. Provided mom with BF booklet, BF resources, & feeding log; mom made aware of O/P services, breastfeeding support groups, community resources, and our phone # for post-discharge questions. Also discussed BF pages in Baby & Me booklet. Discussed WIC pumps with mom and that someone from [facility] would call her on Monday and that she may be able to make an appointment to get the pump when she leaves the hospital & that [facility] usually does not have give pumps with formula so she would need to decide at a later date but that right now her body needs the stimulation from baby BFing & the pump.  Mom had already been given information on LPI protocol so reminded mom to BF with feeding cues at least q 3hrs and then pump afterwards. Encouraged mom to call for LC at next feeding to assist with latch. [facility] faxed referral to [facility]  Maternal Data    Feeding Feeding Type: Formula  LATCH Score/Interventions                      Lactation Tools Discussed/Used WIC Program: Yes   Consult Status Consult Status: Follow-up Date: 01/20/16 Follow-up type: In-patient    Oneal Grout 01/19/2016, 3:32 PM

## 2016-01-19 NOTE — Progress Notes (Signed)
Subjective: Postpartum Day 1: Cesarean Delivery Patient reports nausea and vomiting.   Pt reports emesis after taking a percocet on empty stomach...   Objective: Vital signs in last 24 hours: Temp:  [98.2 F (36.8 C)-98.8 F (37.1 C)] 98.4 F (36.9 C) (07/23 0747) Pulse Rate:  [85-112] 92 (07/23 0747) Resp:  [18-22] 18 (07/23 0700) BP: (94-172)/(45-111) 139/83 (07/23 0700) SpO2:  [94 %-99 %] 94 % (07/23 0700)  Physical Exam:  General: alert and cooperative Lochia: appropriate Uterine Fundus: firm Incision: bandage clean dry intact  DVT Evaluation: No evidence of DVT seen on physical exam.  Results for orders placed or performed during the hospital encounter of 01/16/16 (from the past 24 hour(s))  Glucose, capillary     Status: Abnormal   Collection Time: 01/18/16  1:11 PM  Result Value Ref Range   Glucose-Capillary 105 (H) 65 - 99 mg/dL  Glucose, capillary     Status: Abnormal   Collection Time: 01/18/16  4:59 PM  Result Value Ref Range   Glucose-Capillary 107 (H) 65 - 99 mg/dL  CBC with Differential/Platelet     Status: Abnormal   Collection Time: 01/18/16  5:58 PM  Result Value Ref Range   WBC 10.9 (H) 4.0 - 10.5 K/uL   RBC 3.79 (L) 3.87 - 5.11 MIL/uL   Hemoglobin 9.8 (L) 12.0 - 15.0 g/dL   HCT 29.9 (L) 24.2 - 68.3 %   MCV 77.0 (L) 78.0 - 100.0 fL   MCH 25.9 (L) 26.0 - 34.0 pg   MCHC 33.6 30.0 - 36.0 g/dL   RDW 41.9 (H) 62.2 - 29.7 %   Platelets 203 150 - 400 K/uL   Neutrophils Relative % 79 %   Lymphocytes Relative 17 %   Monocytes Relative 3 %   Eosinophils Relative 1 %   Basophils Relative 0 %   Other 0 %   Neutro Abs 8.6 (H) 1.7 - 7.7 K/uL   Lymphs Abs 1.9 0.7 - 4.0 K/uL   Monocytes Absolute 0.3 0.1 - 1.0 K/uL   Eosinophils Absolute 0.1 0.0 - 0.7 K/uL   Basophils Absolute 0.0 0.0 - 0.1 K/uL  Comprehensive metabolic panel     Status: Abnormal   Collection Time: 01/18/16  5:58 PM  Result Value Ref Range   Sodium 134 (L) 135 - 145 mmol/L   Potassium 4.2  3.5 - 5.1 mmol/L   Chloride 106 101 - 111 mmol/L   CO2 20 (L) 22 - 32 mmol/L   Glucose, Bld 101 (H) 65 - 99 mg/dL   BUN 7 6 - 20 mg/dL   Creatinine, Ser 9.89 0.44 - 1.00 mg/dL   Calcium 7.7 (L) 8.9 - 10.3 mg/dL   Total Protein 6.0 (L) 6.5 - 8.1 g/dL   Albumin 2.3 (L) 3.5 - 5.0 g/dL   AST 21 15 - 41 U/L   ALT 18 14 - 54 U/L   Alkaline Phosphatase 125 38 - 126 U/L   Total Bilirubin 0.2 (L) 0.3 - 1.2 mg/dL   GFR calc non Af Amer >60 >60 mL/min   GFR calc Af Amer >60 >60 mL/min   Anion gap 8 5 - 15  Lactate dehydrogenase     Status: Abnormal   Collection Time: 01/18/16  5:58 PM  Result Value Ref Range   LDH 205 (H) 98 - 192 U/L  Magnesium     Status: Abnormal   Collection Time: 01/18/16  5:58 PM  Result Value Ref Range   Magnesium 5.2 (H) 1.7 -  2.4 mg/dL  Glucose, capillary     Status: Abnormal   Collection Time: 01/18/16  9:08 PM  Result Value Ref Range   Glucose-Capillary 118 (H) 65 - 99 mg/dL  CBC     Status: Abnormal   Collection Time: 01/19/16  1:55 AM  Result Value Ref Range   WBC 17.0 (H) 4.0 - 10.5 K/uL   RBC 3.92 3.87 - 5.11 MIL/uL   Hemoglobin 10.0 (L) 12.0 - 15.0 g/dL   HCT 16.1 (L) 09.6 - 04.5 %   MCV 77.0 (L) 78.0 - 100.0 fL   MCH 25.5 (L) 26.0 - 34.0 pg   MCHC 33.1 30.0 - 36.0 g/dL   RDW 40.9 (H) 81.1 - 91.4 %   Platelets 206 150 - 400 K/uL  Comprehensive metabolic panel     Status: Abnormal   Collection Time: 01/19/16  6:51 AM  Result Value Ref Range   Sodium 134 (L) 135 - 145 mmol/L   Potassium 4.8 3.5 - 5.1 mmol/L   Chloride 105 101 - 111 mmol/L   CO2 21 (L) 22 - 32 mmol/L   Glucose, Bld 132 (H) 65 - 99 mg/dL   BUN 9 6 - 20 mg/dL   Creatinine, Ser 7.82 0.44 - 1.00 mg/dL   Calcium 7.4 (L) 8.9 - 10.3 mg/dL   Total Protein 5.7 (L) 6.5 - 8.1 g/dL   Albumin 2.0 (L) 3.5 - 5.0 g/dL   AST 28 15 - 41 U/L   ALT 14 14 - 54 U/L   Alkaline Phosphatase 109 38 - 126 U/L   Total Bilirubin 0.6 0.3 - 1.2 mg/dL   GFR calc non Af Amer >60 >60 mL/min   GFR calc Af  Amer >60 >60 mL/min   Anion gap 8 5 - 15    Assessment/Plan: Status post Cesarean section. severe preeclampsia.. Continue mag sulfate until 2359 pm  DVT prophylaxis given morbid obesity continue SVD start lovenox 24 hours post cesarean section  Routine postpartum cesarean section care .  Rilee Knoll J. 01/19/2016, 10:58 AM

## 2016-01-19 NOTE — Clinical Social Work Maternal (Signed)
  CLINICAL SOCIAL WORK MATERNAL/CHILD NOTE  Patient Details  Name: Ashley Romero MRN: [id number] Date of Birth: [birth year]  Date:  01/19/2016  Clinical Social Worker Initiating Note:   (Gaberiel Youngblood, lcsw) Date/ Time Initiated:  01/19/16/      Child's Name:      Legal Guardian:  Mother   Need for Interpreter:  None   Date of Referral:  01/18/16     Reason for Referral:      Referral Source:  RN   Address:   ([address] [place] [phone])  Phone number:  [phone]   Household Members:  Self, Parents   Natural Supports (not living in the home):  Spouse/significant other, Extended Family   Professional Supports: None   Employment: Unemployed (pt was previously employed at [employer])   Type of Work:     Education:  Database administrator Resources:  Medicaid   Other Resources:  [facility]   Cultural/Religious Considerations Which May Impact Care: None noted. Strengths:    Basic needs met, home prepared for child.  Risk Factors/Current Problems:  Mental Health Concerns    Cognitive State:  Alert , Able to Concentrate    Mood/Affect:  Relaxed , Calm    CSW Assessment:  CSW met with pt re: c/s for maternal MH concerns.  Initially, pt admitted to having a hx of depression, but then admitted to have bipolar d/o when questioned by CSW.  Pt states that she was on meds prior to her pregnancy, but she was not able to tell CSW the names of these meds, instead asking me to "ask her mom."  Maternal grandmother not at bedside during visit. Pt says she will re-start her psychotropic medication since she has given birth, but the medicine is "at home."  PPD discussed with pt being familiar with the term/illness.  Pt agreeable to following up with her MD re: changes in mood/behavior.    Pt plans on returning home at d/c to her parents home and believes that her family will help her care for baby.  She states that the FOB is supportive, but that they are not together.  Pt  denies needing any supplies or equipment for the baby and states that her home is prepared to receive baby at d/c. Emotional support provided.  CSW will ask unit CSW to re-visit pt prior to d/c for confirmation of MH meds/treatment, as well as any other CSW needs that may arise.  CSW Plan/Description:  Psychosocial Support and Ongoing Assessment of Needs    Roanna Raider, LCSW 01/19/2016, 4:16 PM

## 2016-01-19 NOTE — Op Note (Signed)
Late entry due to EPIC:  PreOp Diagnosis: 1) Intrauterine pregnancy @ [redacted]w[redacted]d 2) Arrest of dilation 3) Severe preeclampsia 4) GDMA2 5) Morbid Obesity PostOp Diagnosis: same Procedure: Primary LTCS Surgeon: Dr. Myna Hidalgo Assistant: Sherre Scarlet, CNM Anesthesia: epidural Complications: none EBL: 800cc UOP: 50cc Fluids: 1800cc   Findings: Female infant from vertex OA presentation, normal uterus tubes and ovaries bilaterally.  PROCEDURE:  Informed consent was obtained from the patient with risks, benefits, complications, treatment options, and expected outcomes discussed with the patient.  The patient concurred with the proposed plan, giving informed consent with form signed.   The patient was taken to Operating Room, and identified with the procedure verified as C-Section Delivery with Time Out. Traxi was used to help with visualization.  The patient was prepped and draped in the usual sterile fashion. A Pfannenstiel incision was made and carried down through the subcutaneous tissue to the fascia. The fascia was incised in the midline and extended transversely. The superior aspect of the fascial incision was grasped with Kochers elevated and the underlying muscle dissected off. The inferior aspect of the facial incision was in similar fashion, grasped elevated and rectus muscles dissected off. The peritoneum was identified and entered. Peritoneal incision was extended longitudinally. The Alexis retractor was placed.  The utero-vesical peritoneal reflection was identified and incised transversely with the [facility] scissors, the incision extended laterally, the bladder flap created digitally. A low transverse uterine incision was made and the infants head delivered atraumatically. After the umbilical cord was clamped and cut cord blood was obtained for evaluation.   The placenta was removed intact and appeared normal. The uterine outline, tubes and ovaries appeared normal. The uterine incision was  closed with running locked sutures of 0 Vicryl and a second layer of the same stitch was used in an imbricating fashion.  Excellent hemostasis was obtained.  The pericolic gutters were then cleared of all clots and debris. Interceed was placed over the incision.  Peritoneum was closed in a running fashion.  The fascia was then reapproximated with running sutures of 0 Vicryl. The subcutaneous tissue was reapproximated with 2-0 plain gut suture.  The skin was closed with 4-0 vicryl in a subcuticular fashion.  PICO dressing was applied.  Instrument, sponge, and needle counts were correct prior the abdominal closure and at the conclusion of the case. The patient was taken to recovery in stable condition.  Myna Hidalgo, DO [phone] (pager) [phone] (office)

## 2016-01-19 NOTE — Anesthesia Postprocedure Evaluation (Signed)
Anesthesia Post Note  Patient: Ashley Romero  Procedure(s) Performed: Procedure(s) (LRB): CESAREAN SECTION (N/A)  Patient location during evaluation: PACU Anesthesia Type: Epidural Level of consciousness: oriented and awake and alert Pain management: pain level controlled Vital Signs Assessment: post-procedure vital signs reviewed and stable Respiratory status: spontaneous breathing, respiratory function stable and patient connected to nasal cannula oxygen Cardiovascular status: blood pressure returned to baseline and stable Postop Assessment: no headache, no backache and epidural receding Anesthetic complications: no     Last Vitals:  Vitals:   01/19/16 0503 01/19/16 0520  BP: (!) 94/45   Pulse: 91   Resp: 18 18  Temp:      Last Pain:  Vitals:   01/19/16 0520  TempSrc:   PainSc: 0-No pain   Pain Goal: Patients Stated Pain Goal: 5 (01/19/16 0425)               Shelton Silvas

## 2016-01-19 NOTE — Transfer of Care (Signed)
Immediate Anesthesia Transfer of Care Note  Patient: Ashley Romero  Procedure(s) Performed: Procedure(s): CESAREAN SECTION (N/A)  Patient Location: [facility]  Anesthesia Type:Epidural  Level of Consciousness: awake, alert  and oriented  Airway & Oxygen Therapy: Patient Spontanous Breathing  Post-op Assessment: Report given to RN  Post vital signs: Reviewed  Last Vitals:  Vitals:   01/19/16 0503 01/19/16 0520  BP: (!) 94/45   Pulse: 91   Resp: 18 18  Temp:      Last Pain:  Vitals:   01/19/16 0520  TempSrc:   PainSc: 0-No pain      Patients Stated Pain Goal: 5 (01/19/16 0425)  Complications: No apparent anesthesia complications

## 2016-01-20 LAB — GLUCOSE, CAPILLARY
GLUCOSE-CAPILLARY: 113 mg/dL — AB (ref 65–99)
GLUCOSE-CAPILLARY: 106 mg/dL — AB (ref 65–99)
GLUCOSE-CAPILLARY: 98 mg/dL (ref 65–99)
GLUCOSE-CAPILLARY: 103 mg/dL — AB (ref 65–99)

## 2016-01-20 LAB — CBC
HCT: 24.8 % — ABNORMAL LOW (ref 36.0–46.0)
Hemoglobin: 8.3 g/dL — ABNORMAL LOW (ref 12.0–15.0)
MCH: 25.9 pg — AB (ref 26.0–34.0)
MCHC: 33.5 g/dL (ref 30.0–36.0)
MCV: 77.3 fL — ABNORMAL LOW (ref 78.0–100.0)
PLATELETS: 185 10*3/uL (ref 150–400)
RBC: 3.21 MIL/uL — ABNORMAL LOW (ref 3.87–5.11)
RDW: 16.7 % — AB (ref 11.5–15.5)
WBC: 14 10*3/uL — ABNORMAL HIGH (ref 4.0–10.5)

## 2016-01-20 MED ORDER — NIFEDIPINE ER OSMOTIC RELEASE 30 MG PO TB24
30.0000 mg | ORAL_TABLET | Freq: Every day | ORAL | Status: DC
  Administered 2016-01-20: 30 mg via ORAL
  Filled 2016-01-20: qty 1

## 2016-01-20 MED ORDER — NIFEDIPINE 10 MG PO CAPS
20.0000 mg | ORAL_CAPSULE | Freq: Once | ORAL | Status: AC
  Administered 2016-01-20: 20 mg via ORAL
  Filled 2016-01-20: qty 2

## 2016-01-20 MED ORDER — NIFEDIPINE ER OSMOTIC RELEASE 30 MG PO TB24
60.0000 mg | ORAL_TABLET | Freq: Every day | ORAL | Status: DC
  Administered 2016-01-21: 60 mg via ORAL
  Filled 2016-01-20: qty 2

## 2016-01-20 NOTE — Progress Notes (Addendum)
  Ashley, Romero MRN: [id number]  Subjective: Postpartum Day 2: Cesarean Delivery Patient reports tolerating PO, + flatus and no problems voiding.  Ambulating without difficulty.  Abdominal pain well controlled.  She denies preeclampsia symptoms such as headache, blurry vision, scotomata, chest pain, shortness of breath, right upper quadrant pain.    Objective: Vital signs in last 24 hours: Temp:  [98.3 F (36.8 C)-99.5 F (37.5 C)] 98.3 F (36.8 C) (07/24 1206) Pulse Rate:  [89-105] 89 (07/24 1206) Resp:  [17-20] 18 (07/24 1206) BP: (124-161)/(63-95) 161/84 (07/24 1206)  Physical Exam:  General: alert, cooperative and no distress Lochia: appropriate Uterine Fundus: firm Incision: healing well:  With PICO dressing, clean, dry, intact.   DVT Evaluation: No evidence of DVT seen on physical exam. Calf/Ankle edema is present.   Recent Labs  01/19/16 0155 01/20/16 0610  HGB 10.0* 8.3*  HCT 30.2* 24.8*   Fingerstick gluose values in 100s, on insulin sliding scale and requiring very little insulin coverage.     Assessment/Plan: Status post Cesarean section. Doing well postoperatively.  S/p postpartum magnesium sulfatefor preeclampsia with severe features.  Continue current care. Start procardia 30 mg XL for high BPs.  Lovenox for DVT prophylaxis Encouraged out of bed and ambulation. For removal of PICO dressing in office by end of this week or self removal.   Iron tabs for anemia.   Hoover Browns [facility] 01/20/2016, 2:25 PM

## 2016-01-20 NOTE — Lactation Note (Addendum)
This note was copied from a baby's chart. Lactation Consultation Note  Patient Name: Ashley Romero [id number] Date: 01/20/2016 Reason for consult: Follow-up assessment  Baby 35 hours old. Mom reports that she has been pumping--though not today--but hasn't been getting anything. Enc mom to pump every 2-3 hours for at least 8 times/24 hours. Mom states that she thinks she wants to BF for maybe 2 months. Enc mom to put baby to breast with each feeding, and then supplement afterwards.   Mom enc to call for assistance with latching at the next feeding. Enc mom to watch for feeding cues. Mom has this LC's phone extension and enc to call as needed for assistance.    Maternal Data    Feeding Feeding Type: Bottle Fed - Formula Nipple Type: Slow - flow  LATCH Score/Interventions                      Lactation Tools Discussed/Used     Consult Status Consult Status: Follow-up Date: 01/21/16 Follow-up type: In-patient    Geralynn Ochs 01/20/2016, 11:39 AM

## 2016-01-20 NOTE — Lactation Note (Signed)
This note was copied from a baby's chart. Lactation Consultation Note Follow up visit at 41 hours.  Mom is requesting latch assist.  Baby has had 9 bottle feedings in the past 24 hours.  Mom has been giving formula in bottles because she is tired.  Baby is STS with mom and not showing feeding cues.  Mom had tried football hold and although she had a c/s does not like that position.  LC reviewed position options and tried cross cradle.  Mom is unable to sit up well in bed and needed LC assist with position and holding baby for latch attempt.  Baby is not eager to eat at this time. LC encouraged mom to continue STS and call for assist as needed with latch.  Mom reports pumping once this am and wants to do breast and bottle feeding at home.  LC discussed supply and demand and encouraged mom to pump every 2-3 hours to establish a milk supply.  No further questions at this time.    Patient Name: Ashley Romero [id number] Date: 01/20/2016 Reason for consult: Initial assessment   Maternal Data    Feeding Feeding Type: Breast Fed Nipple Type: Slow - flow  LATCH Score/Interventions Latch: Too sleepy or reluctant, no latch achieved, no sucking elicited. (only a few sucks) Intervention(s): Skin to skin;Teach feeding cues;Waking techniques Intervention(s): Breast massage;Breast compression  Audible Swallowing: None Intervention(s): Skin to skin;Hand expression  Type of Nipple: Everted at rest and after stimulation  Comfort (Breast/Nipple): Soft / non-tender     Hold (Positioning): Full assist, staff holds infant at breast Intervention(s): Breastfeeding basics reviewed;Support Pillows;Position options;Skin to skin  LATCH Score: 4  Lactation Tools Discussed/Used     Consult Status Consult Status: Follow-up Date: 01/21/16 Follow-up type: In-patient    Beronica Lansdale, Arvella Merles 01/20/2016, 5:30 PM

## 2016-01-20 NOTE — Anesthesia Postprocedure Evaluation (Signed)
Anesthesia Post Note  Patient: Ashley Romero  Procedure(s) Performed: Procedure(s) (LRB): CESAREAN SECTION (N/A)  Patient location during evaluation: Antenatal Anesthesia Type: Epidural Level of consciousness: awake, awake and alert, oriented and patient cooperative Pain management: pain level controlled Vital Signs Assessment: post-procedure vital signs reviewed and stable Respiratory status: spontaneous breathing, nonlabored ventilation and respiratory function stable Cardiovascular status: stable Postop Assessment: no headache, no backache, patient able to bend at knees and no signs of nausea or vomiting Anesthetic complications: no     Last Vitals:  Vitals:   01/20/16 0755 01/20/16 1206  BP: 124/63 (!) 161/84  Pulse: 99 89  Resp: 20 18  Temp: 37.1 C 36.8 C    Last Pain:  Vitals:   01/20/16 1244  TempSrc:   PainSc: 7    Pain Goal: Patients Stated Pain Goal: 5 (01/19/16 0425)               Therman Hughlett L

## 2016-01-20 NOTE — Addendum Note (Signed)
Addendum  created 01/20/16 1332 by Yolonda Kida, CRNA   Sign clinical note

## 2016-01-21 LAB — GLUCOSE, CAPILLARY
GLUCOSE-CAPILLARY: 156 mg/dL — AB (ref 65–99)
Glucose-Capillary: 95 mg/dL (ref 65–99)
Glucose-Capillary: 103 mg/dL — ABNORMAL HIGH (ref 65–99)

## 2016-01-21 MED ORDER — FERROUS SULFATE 325 (65 FE) MG PO TBEC: 325.0000 mg | DELAYED_RELEASE_TABLET | Freq: Two times a day (BID) | ORAL | 11 refills | Status: DC

## 2016-01-21 MED ORDER — IBUPROFEN 600 MG PO TABS: 600.0000 mg | ORAL_TABLET | Freq: Four times a day (QID) | ORAL | 0 refills | Status: DC

## 2016-01-21 MED ORDER — OXYCODONE HCL 5 MG PO TABS: 5.0000 mg | ORAL_TABLET | ORAL | 0 refills | Status: DC | PRN

## 2016-01-21 MED ORDER — FERROUS SULFATE 325 (65 FE) MG PO TABS
325.0000 mg | ORAL_TABLET | Freq: Every day | ORAL | Status: DC
  Administered 2016-01-21: 325 mg via ORAL
  Filled 2016-01-21: qty 1

## 2016-01-21 MED ORDER — NIFEDIPINE ER 60 MG PO TB24: 60.0000 mg | ORAL_TABLET | Freq: Every day | ORAL | 1 refills | Status: DC

## 2016-01-21 NOTE — Lactation Note (Signed)
This note was copied from a baby's chart. Lactation Consultation Note Follow up consult with this mom of an early term baby now 37 6/7 weeks CGA. Mom is being discharged to home today, with baby. Mom told that her milk can begin to come in , if she begins pumping, every 3 hours, around the clock. Mom has a [facility] appointment for today, so I advised her to speak to [facility] about pumping and bottle feeding. Mom denies any questions/concerns at this time.   Patient Name: Girl Tiffini Hayduk LOVFI'E Date: 01/21/2016 Reason for consult: Follow-up assessment   Maternal Data    Feeding    LATCH Score/Interventions    Audible Swallowing:  (no milk seen with hand expression - mom has been ill and not putting baby to breast for pumping, now 58 hours PP)                 Lactation Tools Discussed/Used WIC Program: Yes (mom encouraged to talk to [facility] about puomping and bottle feeding)   Consult Status Consult Status: Complete Follow-up type: Call as needed    Alfred Levins 01/21/2016, 10:04 AM

## 2016-01-21 NOTE — Discharge Summary (Signed)
Obstetric Discharge Summary Reason for Admission: induction of labor and with GDM on Insulin and Preeclampsia Prenatal Procedures: NST, Preeclampsia and ultrasound Intrapartum Procedures: cesarean: low cervical, transverse Postpartum Procedures: none Complications-Operative and Postpartum: none  Temp:  [97.6 F (36.4 C)-99.2 F (37.3 C)] 98.8 F (37.1 C) (07/25 0815) Pulse Rate:  [89-123] 123 (07/25 0815) Resp:  [18-20] 20 (07/25 0815) BP: (123-161)/(50-95) 150/88 (07/25 0815) Hemoglobin  Date Value Ref Range Status  01/20/2016 8.3 (L) 12.0 - 15.0 g/dL Final   HCT  Date Value Ref Range Status  01/20/2016 24.8 (L) 36.0 - 46.0 % Final    Hospital Course:  Hospital Course: Admitted in labor and had a CS by Dr Charlotta Newton for FTD. neg GBS. . Delivery was performed by Dr Charlotta Newton without difficulty.  Infant to FTN. Mother and infant then had an  postpartum course where she received insulin for some elevated BS and procardia to control BP, with breast feeding going well. Mom's physical exam was WNL, and she was discharged home in stable condition. Contraception plan was nexplanon.  She received adequate benefit from po pain medications.  Discharge Diagnoses: Term Pregnancy-delivered and Preelampsia  Discharge Information: Date: 01/21/2016 Activity: pelvic rest Diet: routine Medications:    Medication List    TAKE these medications   diphenhydramine-acetaminophen 25-500 MG Tabs tablet Commonly known as:  TYLENOL PM Take 1 tablet by mouth at bedtime as needed (pain).   HUMULIN N 100 UNIT/ML injection Generic drug:  insulin NPH Human Inject 10 Units into the skin 2 (two) times daily.   hydrOXYzine 25 MG capsule Commonly known as:  VISTARIL Take 25 mg by mouth 3 (three) times daily as needed.   ibuprofen 600 MG tablet Commonly known as:  ADVIL,MOTRIN Take 1 tablet (600 mg total) by mouth every 6 (six) hours.   NIFEdipine 60 MG 24 hr tablet Commonly known as:  PROCARDIA-XL/ADALAT  CC Take 1 tablet (60 mg total) by mouth daily.   oxyCODONE 5 MG immediate release tablet Commonly known as:  Oxy IR/ROXICODONE Take 1 tablet (5 mg total) by mouth every 4 (four) hours as needed (pain).   polyethylene glycol packet Commonly known as:  MIRALAX / GLYCOLAX Mix one packet in 8 oz of water and drink once daily prn constipation What changed:  how much to take  how to take this  when to take this  reasons to take this  additional instructions   Prenatal Vitamin 27-0.8 MG Tabs Take 1 tablet by mouth daily.   ranitidine 150 MG tablet Commonly known as:  ZANTAC Take 150 mg by mouth 2 (two) times daily.      Condition: stable Instructions: refer to practice specific booklet Discharge to: home Follow-up Information    Raye Sorrow, LCSW .   Specialty:  Public relations account executive .   Why:  Please call and schedule appointment for medication management.   Contact information: [address] [place] [address] [phone]        [facility] .   Specialty:  Behavioral Health Why:  In case of crisis, please refer to [facility] who is open 24/7 for assessment and needs. Contact informationMike Craze [place] [address] [phone]        [facility] Ob[facility]etric[facility] Gynecology Follow up on 01/24/2016.   Specialty:  Obstetrics and Gynecology Why:  at 9:00 Contact information: [address] [place] [address] [phone]  Newborn Data: Live born  Information for the patient's newborn:  Laverle, Pillard Girl Kassiah [[id number]]  female ; APGAR , ; weight 7 lb 15 oz (3600 g);  Home with mother.  Dyami Umbach A 01/21/2016, 11:46 AM

## 2016-01-21 NOTE — Discharge Instructions (Signed)

## 2016-01-21 NOTE — Progress Notes (Signed)
Dressing dry, adhesive not adhering to skin well, will notify MD to request dressing change.

## 2016-01-21 NOTE — Progress Notes (Addendum)
LCSW followed up with MOB and [name] who were both at the bedside. MOB reports she is excited to go home and [name] appeared stressed. All needs are met for baby in home including bed, car seat, and clothing.  MOB denied needs.  LCSW inquired about MH and referral for MOB to follow to resume medications.  MOB reports she will complete and information was listed on patient's AVS regarding provider for information.  LCSW also inquired about making a healthy start referral for MOB as she is a young MOB with additional needs ( single parent as FOB is supportive, but she is assuming main role, MH issues, and limited resources).  MOB and [name] were both interested and agreeable to program.  MOB reports she plans to continue with care for baby at [facility] in [place].  LCSW inquired about a closer location for Peds and that [name] has other grandchildren who are active in clinic.  Both MOB and [name] were vague and report they will just follow up with [facility].    Referrals will be complete prior to DC. Information placed on AVS for crisis services and follow regarding MH.  ([facility])  LCSW did more research and found MOB was involved in the following through [facility] (2016) when she was admitted inpatient on C/A unit.  Follow-Ups  From inpatient admission.   1. Follow up with [facility] on 01/16/2015; Patient participates in [facility] program  2. Follow up with [facility] on 01/24/2015; Patient is current with medication management and will be seen on 7/28 at 3:45pm by Dr. Headen  3. Follow up with [facility] on 01/18/2015; Patient will be new to therapy and will be seen by [facility] on 7/22 at 9am.    Reshunda Strider LCSW, MSW Clinical Social Work: System Wide Float Coverage for [facility] N[facility] Clinical social worker [phone] 

## 2016-01-21 NOTE — Progress Notes (Signed)
Pt discharged home in stable condition via wheelchair with family.  Discharge instructions and Rx reviewed and given to pt.  Pt verbalized understanding.

## 2016-01-21 NOTE — Progress Notes (Signed)
PICO dressing changed per order. Incision clean,dry and intact.

## 2016-01-23 ENCOUNTER — Encounter: Payer: Self-pay | Admitting: Pediatrics

## 2016-01-23 ENCOUNTER — Encounter (HOSPITAL_COMMUNITY): Payer: Self-pay | Admitting: Obstetrics & Gynecology

## 2016-02-03 ENCOUNTER — Encounter (HOSPITAL_COMMUNITY): Payer: Self-pay | Admitting: *Deleted

## 2016-03-02 ENCOUNTER — Emergency Department (INDEPENDENT_AMBULATORY_CARE_PROVIDER_SITE_OTHER)
Admission: EM | Admit: 2016-03-02 | Discharge: 2016-03-02 | Disposition: A | Discharge status: Home / Self Care | Payer: Medicaid Other | Source: Home / Self Care | Attending: Family Medicine | Admitting: Family Medicine

## 2016-03-02 ENCOUNTER — Encounter: Payer: Self-pay | Admitting: *Deleted

## 2016-03-02 DIAGNOSIS — H6691 Otitis media, unspecified, right ear: Secondary | ICD-10-CM

## 2016-03-02 DIAGNOSIS — H9201 Otalgia, right ear: Secondary | ICD-10-CM

## 2016-03-02 HISTORY — DX: Essential (primary) hypertension: I10

## 2016-03-02 HISTORY — DX: Anemia, unspecified: D64.9

## 2016-03-02 MED ORDER — AMOXICILLIN 500 MG PO CAPS: 500.0000 mg | ORAL_CAPSULE | Freq: Two times a day (BID) | ORAL | 0 refills | Status: AC

## 2016-03-02 NOTE — Discharge Instructions (Signed)
°  You may continue to alternate acetaminophen and ibuprofen for ear pain.    If you do start the antibiotic, be sure to complete the entire course unless otherwise advised by a medical provider.  There is a small risk of your baby developing diarrhea while breast feeding, this should improve as you stop taking antibiotic.  If symptoms become concerning, please follow up with her pediatrician or your OB/GYN.   If your baby develops a rash, please contact your Pediatrician immediately as they may want you to stop the antibiotic.

## 2016-03-02 NOTE — ED Triage Notes (Signed)
Pt c/o RT ear popping and burning x today. Denies fever.

## 2016-03-02 NOTE — ED Provider Notes (Signed)
CSN: [id number]     Arrival date & time 03/02/16  1011 History   First MD Initiated Contact with Patient 03/02/16 1040     Chief Complaint  Patient presents with  . Otalgia   (Consider location/radiation/quality/duration/timing/severity/associated sxs/prior Treatment) HPI Ashley Romero is a 20 y.o. female presenting to UC with c/o Right ear pain that started this morning. Pain is aching and throbbing, with burning sensation, 7/10 at worst.  Pain did improve with ibuprofen taken PTA.  Denies nasal congestion, fever, chills, or sore throat. No n/v/d.    Past Medical History:  Diagnosis Date  . ADHD (attention deficit hyperactivity disorder)   . Anemia   . Anxiety   . Constipation   . Depression   . GERD (gastroesophageal reflux disease)    OTC as needed  . Gestational diabetes   . Hypertension   . Hypertrophy of tonsils and adenoids 08/2013   snores during sleep; had sleep study 03/2013:  AHI 1.9, RDI 2.3  . Irregular menses    Nexplanon implant  . Learning disorder involving mathematics   . Migraines    Past Surgical History:  Procedure Laterality Date  . CESAREAN SECTION N/A 01/18/2016   Procedure: CESAREAN SECTION;  Surgeon: Myna Hidalgo, DO;  Location: [facility];  Service: Obstetrics;  Laterality: N/A;  . TONSILLECTOMY AND ADENOIDECTOMY Bilateral 09/25/2013   Procedure: BILATERAL TONSILLECTOMY AND ADENOIDECTOMY;  Surgeon: Darletta Moll, MD;  Location: [facility];  Service: ENT;  Laterality: Bilateral;  . WISDOM TOOTH EXTRACTION  2015   Family History  Problem Relation Age of Onset  . Asthma Mother   . Hypertension Mother   . Anesthesia problems Mother     hard to wake up post-op  . Migraines Mother   . Depression Mother   . Anxiety disorder Mother   . Heart disease Maternal Grandmother   . Migraines Maternal Grandmother   . Bipolar disorder Maternal Aunt   . Schizophrenia Maternal Aunt   . Depression Maternal Aunt   . ADD / ADHD Cousin    Many paternal 1 st cousins have ADD/ADHD   Social History  Substance Use Topics  . Smoking status: Former Smoker    Packs/day: 0.25    Types: Cigarettes  . Smokeless tobacco: Never Used     Comment: outside smokers at home  . Alcohol use No   OB History    Gravida Para Term Preterm AB Living   1 1 1     1    SAB TAB Ectopic Multiple Live Births         0 1     Review of Systems  Constitutional: Negative for chills and fatigue.  HENT: Positive for ear pain (Right). Negative for congestion, postnasal drip, rhinorrhea, sinus pressure, sneezing and sore throat.   Respiratory: Negative for cough and shortness of breath.   Gastrointestinal: Negative for diarrhea, nausea and vomiting.  Neurological: Negative for dizziness, light-headedness and headaches.    Allergies  Review of patient's allergies indicates no known allergies.  Home Medications   Prior to Admission medications   Medication Sig Start Date End Date Taking? Authorizing Provider  ferrous sulfate 325 (65 FE) MG EC tablet Take 1 tablet (325 mg total) by mouth 2 (two) times daily with a meal. 01/21/16  Yes Naima Dillard, MD  NIFEdipine (PROCARDIA-XL/ADALAT CC) 60 MG 24 hr tablet Take 1 tablet (60 mg total) by mouth daily. 01/21/16  Yes Jaymes Graff, MD  amoxicillin (AMOXIL) 500 MG  capsule Take 1 capsule (500 mg total) by mouth 2 (two) times daily. 03/02/16 03/12/16  Junius FinnerErin O'Malley, PA-C  hydrOXYzine (VISTARIL) 25 MG capsule Take 25 mg by mouth 3 (three) times daily as needed. 11/22/15   Historical Provider, MD  ibuprofen (ADVIL,MOTRIN) 600 MG tablet Take 1 tablet (600 mg total) by mouth every 6 (six) hours. 01/21/16   Jaymes GraffNaima Dillard, MD  Prenatal Vit-Fe Fumarate-FA (PRENATAL VITAMIN) 27-0.8 MG TABS Take 1 tablet by mouth daily. 06/10/15   Ashly Hulen SkainsM Gottschalk, DO  ranitidine (ZANTAC) 150 MG tablet Take 150 mg by mouth 2 (two) times daily. 12/26/15   Historical Provider, MD   Meds Ordered and Administered this Visit  Medications - No  data to display  BP 126/81 (BP Location: Left Arm)   Pulse 69   Temp 98 F (36.7 C) (Oral)   Resp 18   Ht 5\' 1"  (1.549 m)   Wt 250 lb (113.4 kg)   SpO2 96%   BMI 47.24 kg/m  No data found.   Physical Exam  Constitutional: She is oriented to person, place, and time. She appears well-developed and well-nourished. No distress.  HENT:  Head: Normocephalic and atraumatic.  Left Ear: Tympanic membrane normal.  Nose: Nose normal.  Mouth/Throat: Uvula is midline, oropharynx is clear and moist and mucous membranes are normal.  Right TM- hazy appearance, no erythema or bulging.   Eyes: EOM are normal.  Neck: Normal range of motion.  Cardiovascular: Normal rate.   Pulmonary/Chest: Effort normal.  Musculoskeletal: Normal range of motion.  Neurological: She is alert and oriented to person, place, and time.  Skin: Skin is warm and dry. She is not diaphoretic.  Psychiatric: She has a normal mood and affect. Her behavior is normal.  Nursing note and vitals reviewed.   Urgent Care Course   Clinical Course    Procedures (including critical care time)  Labs Review Labs Reviewed - No data to display  Imaging Review No results found.  Tympanometry: Right TM- Wide tympanogram   MDM   1. Acute right otitis media, recurrence not specified, unspecified otitis media type   2. Acute otalgia, right    Pt c/o sudden onset Right ear pain that started this morning. TM is hazy in appearance. Tympanometry c/w early AOM  Rx: Amoxicillin Pt currently breastfeeding. Discussed risk of diarrhea and/or rash with infant.  F/u with Pediatrician as needed. Patient verbalized understanding and agreement with treatment plan.     Junius Finnerrin O'Malley, PA-C 03/02/16 [phone]

## 2016-03-25 DIAGNOSIS — Z8632 Personal history of gestational diabetes: Secondary | ICD-10-CM | POA: Insufficient documentation

## 2016-03-25 HISTORY — DX: Personal history of gestational diabetes: Z86.32

## 2016-04-21 DIAGNOSIS — E663 Overweight: Secondary | ICD-10-CM | POA: Insufficient documentation

## 2016-04-21 DIAGNOSIS — Z6841 Body Mass Index (BMI) 40.0 and over, adult: Secondary | ICD-10-CM

## 2016-04-21 HISTORY — DX: Morbid (severe) obesity due to excess calories: E66.01

## 2016-06-18 DIAGNOSIS — L309 Dermatitis, unspecified: Secondary | ICD-10-CM | POA: Insufficient documentation

## 2016-06-18 HISTORY — DX: Dermatitis, unspecified: L30.9

## 2016-07-31 ENCOUNTER — Emergency Department (INDEPENDENT_AMBULATORY_CARE_PROVIDER_SITE_OTHER): Payer: Medicaid Other

## 2016-07-31 ENCOUNTER — Emergency Department (INDEPENDENT_AMBULATORY_CARE_PROVIDER_SITE_OTHER)
Admission: EM | Admit: 2016-07-31 | Discharge: 2016-07-31 | Disposition: A | Discharge status: Home / Self Care | Payer: Medicaid Other | Source: Home / Self Care | Attending: Family Medicine | Admitting: Family Medicine

## 2016-07-31 ENCOUNTER — Encounter: Payer: Self-pay | Admitting: Emergency Medicine

## 2016-07-31 DIAGNOSIS — S2221XA Fracture of manubrium, initial encounter for closed fracture: Secondary | ICD-10-CM

## 2016-07-31 DIAGNOSIS — R079 Chest pain, unspecified: Secondary | ICD-10-CM

## 2016-07-31 MED ORDER — TRAMADOL HCL 50 MG PO TABS: ORAL_TABLET | ORAL | 0 refills | Status: DC

## 2016-07-31 NOTE — Discharge Instructions (Signed)
Apply ice pack for 20 to 30 minutes, once or twice daily  Continue until pain decreases.  Recommend taking vitamin D and calcium daily (such as Calcitrate or Citracal)
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
## 2016-09-30 ENCOUNTER — Inpatient Hospital Stay (HOSPITAL_COMMUNITY)
Admission: AD | Admit: 2016-09-30 | Discharge: 2016-09-30 | Disposition: A | Discharge status: Home / Self Care | Payer: Medicare Other | Source: Ambulatory Visit | Attending: Obstetrics and Gynecology | Admitting: Obstetrics and Gynecology

## 2016-09-30 ENCOUNTER — Encounter (HOSPITAL_COMMUNITY): Payer: Self-pay | Admitting: *Deleted

## 2016-09-30 DIAGNOSIS — K219 Gastro-esophageal reflux disease without esophagitis: Secondary | ICD-10-CM | POA: Insufficient documentation

## 2016-09-30 DIAGNOSIS — Z8249 Family history of ischemic heart disease and other diseases of the circulatory system: Secondary | ICD-10-CM | POA: Diagnosis not present

## 2016-09-30 DIAGNOSIS — F909 Attention-deficit hyperactivity disorder, unspecified type: Secondary | ICD-10-CM | POA: Diagnosis not present

## 2016-09-30 DIAGNOSIS — Z825 Family history of asthma and other chronic lower respiratory diseases: Secondary | ICD-10-CM | POA: Diagnosis not present

## 2016-09-30 DIAGNOSIS — R42 Dizziness and giddiness: Secondary | ICD-10-CM | POA: Insufficient documentation

## 2016-09-30 DIAGNOSIS — Z3202 Encounter for pregnancy test, result negative: Secondary | ICD-10-CM | POA: Insufficient documentation

## 2016-09-30 DIAGNOSIS — I10 Essential (primary) hypertension: Secondary | ICD-10-CM | POA: Diagnosis not present

## 2016-09-30 DIAGNOSIS — D649 Anemia, unspecified: Secondary | ICD-10-CM | POA: Diagnosis not present

## 2016-09-30 DIAGNOSIS — F17210 Nicotine dependence, cigarettes, uncomplicated: Secondary | ICD-10-CM | POA: Insufficient documentation

## 2016-09-30 DIAGNOSIS — G43909 Migraine, unspecified, not intractable, without status migrainosus: Secondary | ICD-10-CM | POA: Insufficient documentation

## 2016-09-30 DIAGNOSIS — N938 Other specified abnormal uterine and vaginal bleeding: Secondary | ICD-10-CM | POA: Insufficient documentation

## 2016-09-30 DIAGNOSIS — Z9889 Other specified postprocedural states: Secondary | ICD-10-CM | POA: Diagnosis not present

## 2016-09-30 DIAGNOSIS — Z818 Family history of other mental and behavioral disorders: Secondary | ICD-10-CM | POA: Insufficient documentation

## 2016-09-30 LAB — URINALYSIS, MICROSCOPIC (REFLEX)

## 2016-09-30 LAB — POCT PREGNANCY, URINE: Preg Test, Ur: NEGATIVE

## 2016-09-30 LAB — CBC
HEMATOCRIT: 34.9 % — AB (ref 36.0–46.0)
HEMOGLOBIN: 11.7 g/dL — AB (ref 12.0–15.0)
MCH: 26.7 pg (ref 26.0–34.0)
MCHC: 33.5 g/dL (ref 30.0–36.0)
MCV: 79.7 fL (ref 78.0–100.0)
Platelets: 261 10*3/uL (ref 150–400)
RBC: 4.38 MIL/uL (ref 3.87–5.11)
RDW: 16.4 % — ABNORMAL HIGH (ref 11.5–15.5)
WBC: 11.2 10*3/uL — ABNORMAL HIGH (ref 4.0–10.5)

## 2016-09-30 LAB — URINALYSIS, ROUTINE W REFLEX MICROSCOPIC
GLUCOSE, UA: NEGATIVE mg/dL
Ketones, ur: 15 mg/dL — AB
Nitrite: POSITIVE — AB
PROTEIN: 100 mg/dL — AB
SPECIFIC GRAVITY, URINE: 1.025 (ref 1.005–1.030)
pH: 5.5 (ref 5.0–8.0)

## 2016-09-30 MED ORDER — IBUPROFEN 600 MG PO TABS: 600.0000 mg | ORAL_TABLET | Freq: Four times a day (QID) | ORAL | 1 refills | Status: DC | PRN

## 2016-09-30 NOTE — Discharge Instructions (Signed)
Dysfunctional Uterine Bleeding °Dysfunctional uterine bleeding is abnormal bleeding from the uterus. Dysfunctional uterine bleeding includes: °· A period that comes earlier or later than usual. °· A period that is lighter, heavier, or has blood clots. °· Bleeding between periods. °· Skipping one or more periods. °· Bleeding after sexual intercourse. °· Bleeding after menopause. °Follow these instructions at home: °Pay attention to any changes in your symptoms. Follow these instructions to help with your condition: °Eating and drinking  °· Eat well-balanced meals. Include foods that are high in iron, such as liver, meat, shellfish, green leafy vegetables, and eggs. °· If you become constipated: °¨ Drink plenty of water. °¨ Eat fruits and vegetables that are high in water and fiber, such as spinach, carrots, raspberries, apples, and mango. °Medicines  °· Take over-the-counter and prescription medicines only as told by your health care provider. °· Do not change medicines without talking with your health care provider. °· Aspirin or medicines that contain aspirin may make the bleeding worse. Do not take those medicines: °¨ During the week before your period. °¨ During your period. °· If you were prescribed iron pills, take them as told by your health care provider. Iron pills help to replace iron that your body loses because of this condition. °Activity  °· If you need to change your sanitary pad or tampon more than one time every 2 hours: °¨ Lie in bed with your feet raised (elevated). °¨ Place a cold pack on your lower abdomen. °¨ Rest as much as possible until the bleeding stops or slows down. °· Do not try to lose weight until the bleeding has stopped and your blood iron level is back to normal. °Other Instructions  °· For two months, write down: °¨ When your period starts. °¨ When your period ends. °¨ When any abnormal bleeding occurs. °¨ What problems you notice. °· Keep all follow up visits as told by your  health care provider. This is important. °Contact a health care provider if: °· You get light-headed or weak. °· You have nausea and vomiting. °· You cannot eat or drink without vomiting. °· You feel dizzy or have diarrhea while you are taking medicines. °· You are taking birth control pills or hormones, and you want to change them or stop taking them. °Get help right away if: °· You develop a fever or chills. °· You need to change your sanitary pad or tampon more than one time per hour. °· Your bleeding becomes heavier, or your flow contains clots more often. °· You develop pain in your abdomen. °· You lose consciousness. °· You develop a rash. °This information is not intended to replace advice given to you by your health care provider. Make sure you discuss any questions you have with your health care provider. °Document Released: 06/12/2000 Document Revised: 11/21/2015 Document Reviewed: 09/10/2014 °Elsevier Interactive Patient Education © 2017 Elsevier Inc[employer] ° °Prenatal Care Providers °[facility]    [facility] ° Phone- [phone]     Phone: [phone] °         °[facility]                      [facility] ° @[place]     Phone: [phone] ° Phone: [phone] °        [facility] °[facility]     Phone: [phone] ° Phone: [phone]   °          [facility] °[facility] [place]                hone: [phone] ° Phone: [phone] °        [facility] °Dr. Bernard Marshall      Phone: [phone] ° Phone: [phone] °        [facility] °[facility]                Phone: [phone] ° [facility]  ° Phone:[phone]    [facility] [place] °         Phone: [phone] °[facility] °  Phone: [phone] ° °

## 2016-09-30 NOTE — MAU Note (Signed)
Pt stated she has not had a period in 3 years. hjad a baby 8 months ago and has not resumed her period. (only breast fed for a week) Pt started bleeding 2 days ago and it is very heavy. Passing clots. Having cramping as well.

## 2016-09-30 NOTE — MAU Provider Note (Signed)
Chief Complaint:  Vaginal Bleeding   First Provider Initiated Contact with Patient 09/30/16 1801       HPI: Ashley Romero is a 21 y.o. G1P1001 who presents to maternity admissions reporting heavy vaginal bleeding with period.  Has not had a period in 8 months since she had her baby.  Not on any birth control.   Bleeding started 2 days ago and is heavy with clots. Feels weak and dizzy, thinks she is dehydrated, despite drinking water regularly. She reports vaginal bleeding, but no vaginal itching/burning, urinary symptoms, h/a, n/v, or fever/chills.    Hopped off bed to remove clothing, does not appear to be dizzy. Bed was in high position, she hopped off of it and then climbed back onto it with no difficulty.  Vaginal Bleeding  The patient's primary symptoms include vaginal bleeding. The patient's pertinent negatives include no genital itching, genital lesions, genital odor, pelvic pain or vaginal discharge. This is a new problem. The current episode started in the past 7 days. The problem occurs constantly. The problem has been unchanged. The patient is experiencing no pain. The problem affects both sides. She is not pregnant. Pertinent negatives include no abdominal pain, back pain, chills, constipation, diarrhea, dysuria, fever, headaches, nausea or vomiting. The vaginal discharge was bloody. The vaginal bleeding is heavier than menses. She has been passing clots. She has not been passing tissue. Nothing aggravates the symptoms. She has tried nothing for the symptoms. She is sexually active. She uses nothing for contraception.   RN Note: Pt stated she has not had a period in 3 years. hjad a baby 8 months ago and has not resumed her period. (only breast fed for a week) Pt started bleeding 2 days ago and it is very heavy. Passing clots. Having cramping as well.  Past Medical History: Past Medical History:  Diagnosis Date  . ADHD (attention deficit hyperactivity disorder)   . Anemia   .  Anxiety   . Constipation   . Depression   . GERD (gastroesophageal reflux disease)    OTC as needed  . Gestational diabetes   . Hypertension   . Hypertrophy of tonsils and adenoids 08/2013   snores during sleep; had sleep study 03/2013:  AHI 1.9, RDI 2.3  . Irregular menses    Nexplanon implant  . Learning disorder involving mathematics   . Migraines     Past obstetric history: OB History  Gravida Para Term Preterm AB Living  SAB TAB Ectopic Multiple Live Births        0 1    # Outcome Date GA Lbr Len/2nd Weight Sex Delivery Anes PTL Lv  1 Term 01/18/16 [redacted]w[redacted]d  8 lb 15.7 oz (4.075 kg) F CS-LTranv EPI  LIV      Past Surgical History: Past Surgical History:  Procedure Laterality Date  . CESAREAN SECTION N/A 01/18/2016   Procedure: CESAREAN SECTION;  Surgeon: Myna Hidalgo, DO;  [facility]: [facility];  Service: Obstetrics;  Laterality: N/A;  . TONSILLECTOMY AND ADENOIDECTOMY Bilateral 09/25/2013   Procedure: BILATERAL TONSILLECTOMY AND ADENOIDECTOMY;  Surgeon: Darletta Moll, MD;  Location: [facility];  Service: ENT;  Laterality: Bilateral;  . WISDOM TOOTH EXTRACTION  2015    Family History: Family History  Problem Relation Age of Onset  . Asthma Mother   . Hypertension Mother   . Anesthesia problems Mother     hard to wake up post-op  . Migraines  Mother   . Depression Mother   . Anxiety disorder Mother   . Heart disease Maternal Grandmother   . Migraines Maternal Grandmother   . Bipolar disorder Maternal Aunt   . Schizophrenia Maternal Aunt   . Depression Maternal Aunt   . ADD / ADHD Cousin     Many paternal 1 st cousins have ADD/ADHD    Social History: Social History  Substance Use Topics  . Smoking status: Current Every Day Smoker    Packs/day: 0.25    Types: Cigarettes  . Smokeless tobacco: Never Used     Comment: outside smokers at home  . Alcohol use No    Allergies: No Known Allergies  Meds:  Prescriptions Prior to  Admission  Medication Sig Dispense Refill Last Dose  . ferrous sulfate 325 (65 FE) MG EC tablet Take 1 tablet (325 mg total) by mouth 2 (two) times daily with a meal. 90 tablet 11   . hydrOXYzine (VISTARIL) 25 MG capsule Take 25 mg by mouth 3 (three) times daily as needed.  0 Past Week at Unknown time  . ibuprofen (ADVIL,MOTRIN) 600 MG tablet Take 1 tablet (600 mg total) by mouth every 6 (six) hours. 30 tablet 0   . NIFEdipine (PROCARDIA-XL/ADALAT CC) 60 MG 24 hr tablet Take 1 tablet (60 mg total) by mouth daily. 30 tablet 1   . Prenatal Vit-Fe Fumarate-FA (PRENATAL VITAMIN) 27-0.8 MG TABS Take 1 tablet by mouth daily. 30 tablet 12 01/15/2016 at Unknown time  . ranitidine (ZANTAC) 150 MG tablet Take 150 mg by mouth 2 (two) times daily.  1 01/15/2016 at Unknown time  . traMADol (ULTRAM) 50 MG tablet Take one tab at bedtime as needed for pain 15 tablet 0     I have reviewed patient's Past Medical Hx, Surgical Hx, Family Hx, Social Hx, medications and allergies.  ROS:  Review of Systems  Constitutional: Negative for chills and fever.  Gastrointestinal: Negative for abdominal pain, constipation, diarrhea, nausea and vomiting.  Genitourinary: Positive for vaginal bleeding. Negative for dysuria, pelvic pain and vaginal discharge.  Musculoskeletal: Negative for back pain.  Neurological: Negative for headaches.   Other systems negative     Physical Exam  Patient Vitals for the past 24 hrs:  BP Temp Pulse Resp Height Weight  09/30/16 1754 122/76 98.6 F (37 C) 77 18  (1.549 m) 277 lb (125.6 kg)   Constitutional: Well-developed, well-nourished female in no acute distress.  Cardiovascular: normal rate and rhythm, no ectopy audible, S1 & S2 heard, no murmur Respiratory: normal effort, no distress. Lungs CTAB with no wheezes or crackles GI: Abd soft, non-tender.  Nondistended.  No rebound, No guarding.  Bowel Sounds audible  MS: Extremities nontender, no edema, normal ROM Neurologic: Alert  and oriented x 4.   Grossly nonfocal. GU: Neg CVAT. Skin:  Warm and Dry Psych:  Affect appropriate.  PELVIC EXAM: Cervix pink, visually closed, without lesion, small to moderate amt of bloody mucous in vault, no active bleeding or hemorrhage, vaginal walls and external genitalia normal Bimanual exam: Cervix firm, anterior, neg CMT, uterus nontender, nonenlarged, adnexa without tenderness, enlargement, or mass    Labs: --/--/O POS (07/20 2123) Results for orders placed or performed during the hospital encounter of 09/30/16 (from the past 24 hour(s))  Urinalysis, Routine w reflex microscopic     Status: Abnormal   Collection Time: 09/30/16  5:58 PM  Result Value Ref Range   Color, Urine RED (A) YELLOW   APPearance HAZY (A)  CLEAR   Specific Gravity, Urine 1.025 1.005 - 1.030   pH 5.5 5.0 - 8.0   Glucose, UA NEGATIVE NEGATIVE mg/dL   Hgb urine dipstick LARGE (A) NEGATIVE   Bilirubin Urine SMALL (A) NEGATIVE   Ketones, ur 15 (A) NEGATIVE mg/dL   Protein, ur 914 (A) NEGATIVE mg/dL   Nitrite POSITIVE (A) NEGATIVE   Leukocytes, UA TRACE (A) NEGATIVE  Urinalysis, Microscopic (reflex)     Status: Abnormal   Collection Time: 09/30/16  5:58 PM  Result Value Ref Range   RBC / HPF TOO NUMEROUS TO COUNT 0 - 5 RBC/hpf   WBC, UA 6-30 0 - 5 WBC/hpf   Bacteria, UA MANY (A) NONE SEEN   Squamous Epithelial / LPF 6-30 (A) NONE SEEN  Pregnancy, urine POC     Status: None   Collection Time: 09/30/16  6:06 PM  Result Value Ref Range   Preg Test, Ur NEGATIVE NEGATIVE  CBC     Status: Abnormal   Collection Time: 09/30/16  6:16 PM  Result Value Ref Range   WBC 11.2 (H) 4.0 - 10.5 K/uL   RBC 4.38 3.87 - 5.11 MIL/uL   Hemoglobin 11.7 (L) 12.0 - 15.0 g/dL   HCT 78.2 (L) 95.6 - 21.3 %   MCV 79.7 78.0 - 100.0 fL   MCH 26.7 26.0 - 34.0 pg   MCHC 33.5 30.0 - 36.0 g/dL   RDW 08.6 (H) 57.8 - 46.9 %   Platelets 261 150 - 400 K/uL    Imaging:  No results found.  MAU Course/MDM: I have ordered labs as  follows:  UA, CBC Imaging ordered: none Results reviewed.   No anemia. Hemoglobin has improved from 8.3 (8 mos ago) to 11.7   Reassured her blood loss has not been significant Likely, she will bleed a few more days then her cycles may return to normal Encouraged her to get contraception if she does not want to become pregnant List of GYN providers given Pt stable at time of discharge.  Assessment: DUB (dysfunctional uterine bleeding) - Plan: Discharge patient   Plan: Discharge home Recommend Follow up with GYN Rx sent for ibuprofen  for cramping and to possibly decrease bleeding   Encouraged to return here or to other Urgent Care/ED if she develops worsening of symptoms, increase in pain, fever, or other concerning symptoms.   Wynelle Bourgeois CNM, MSN Certified Nurse-Midwife 09/30/2016 6:02 PM

## 2016-10-20 DIAGNOSIS — G4730 Sleep apnea, unspecified: Secondary | ICD-10-CM | POA: Diagnosis not present

## 2016-10-20 DIAGNOSIS — Z6841 Body Mass Index (BMI) 40.0 and over, adult: Secondary | ICD-10-CM | POA: Diagnosis not present

## 2016-10-26 ENCOUNTER — Encounter: Payer: Self-pay | Admitting: Obstetrics & Gynecology

## 2016-10-26 ENCOUNTER — Ambulatory Visit (INDEPENDENT_AMBULATORY_CARE_PROVIDER_SITE_OTHER): Payer: Medicare Other | Admitting: Obstetrics & Gynecology

## 2016-10-26 VITALS — BP 118/67 | HR 79 | Ht 60.0 in | Wt 269.0 lb

## 2016-10-26 DIAGNOSIS — N912 Amenorrhea, unspecified: Secondary | ICD-10-CM | POA: Diagnosis not present

## 2016-10-26 DIAGNOSIS — Z8759 Personal history of other complications of pregnancy, childbirth and the puerperium: Secondary | ICD-10-CM

## 2016-10-26 DIAGNOSIS — E282 Polycystic ovarian syndrome: Secondary | ICD-10-CM

## 2016-10-26 DIAGNOSIS — Z3202 Encounter for pregnancy test, result negative: Secondary | ICD-10-CM | POA: Diagnosis not present

## 2016-10-26 DIAGNOSIS — Z8632 Personal history of gestational diabetes: Secondary | ICD-10-CM | POA: Insufficient documentation

## 2016-10-26 HISTORY — DX: Personal history of other complications of pregnancy, childbirth and the puerperium: Z87.59

## 2016-10-26 LAB — POCT URINE PREGNANCY: Preg Test, Ur: NEGATIVE

## 2016-10-26 MED ORDER — NORGESTIMATE-ETH ESTRADIOL 0.25-35 MG-MCG PO TABS: 1.0000 | ORAL_TABLET | Freq: Every day | ORAL | 11 refills | Status: DC

## 2016-10-27 NOTE — Progress Notes (Signed)
   Subjective:    Patient ID: Ashley Romero, female    DOB: February 10, 1996, 21 y.o.   MRN: [id number]  HPI  21 yo female with PCO and morbid obesity presents for amenorrhea and one very heavy menses.  Pt went to MAU on 09/30/16 with heavy bleeding.  Pt has had issues with irregular cycles for most of life.  She was able to conceive and delivered by c/s in 2017.  Pt is sexualyl active.  Pt does not want to become pregnant.    Review of Systems  Constitutional: Negative.   Respiratory: Negative.   Cardiovascular: Negative.   Gastrointestinal: Negative.   Endocrine: Negative.   Genitourinary: Positive for menstrual problem.  Hematological: Negative.        Objective:   Physical Exam  Constitutional: She is oriented to person, place, and time. She appears well-developed and well-nourished. No distress.  HENT:  Head: Normocephalic and atraumatic.  Eyes: Conjunctivae are normal.  Pulmonary/Chest: Effort normal.  Abdominal: Soft. There is no tenderness.  Musculoskeletal: She exhibits no edema.  Neurological: She is alert and oriented to person, place, and time.  Skin: Skin is warm and dry.  Psychiatric: She has a normal mood and affect.  Vitals reviewed.  Vitals:   10/26/16 1406  BP: 118/67  Pulse: 79  Weight: 269 lb (122 kg)  Height: 5' (1.524 m)      Assessment & Plan:  21 yo female with PCO annovulation.  1-Try OCPs for cycle regularity 2-Pap at 22 3-Condoms for STD protection 4-RTC 3 months for re-evaluation.

## 2017-01-25 ENCOUNTER — Other Ambulatory Visit: Payer: Self-pay | Admitting: Advanced Practice Midwife

## 2017-01-26 DIAGNOSIS — H9190 Unspecified hearing loss, unspecified ear: Secondary | ICD-10-CM | POA: Diagnosis not present

## 2017-02-25 ENCOUNTER — Other Ambulatory Visit: Payer: Self-pay | Admitting: *Deleted

## 2017-02-25 DIAGNOSIS — N946 Dysmenorrhea, unspecified: Secondary | ICD-10-CM

## 2017-02-25 MED ORDER — IBUPROFEN 600 MG PO TABS: ORAL_TABLET | ORAL | 0 refills | Status: DC

## 2017-03-24 DIAGNOSIS — N898 Other specified noninflammatory disorders of vagina: Secondary | ICD-10-CM | POA: Diagnosis not present

## 2017-05-25 DIAGNOSIS — Z6841 Body Mass Index (BMI) 40.0 and over, adult: Secondary | ICD-10-CM | POA: Diagnosis not present

## 2017-05-25 DIAGNOSIS — Z0000 Encounter for general adult medical examination without abnormal findings: Secondary | ICD-10-CM | POA: Diagnosis not present

## 2017-05-25 DIAGNOSIS — F3130 Bipolar disorder, current episode depressed, mild or moderate severity, unspecified: Secondary | ICD-10-CM | POA: Diagnosis not present

## 2017-05-25 DIAGNOSIS — Z8632 Personal history of gestational diabetes: Secondary | ICD-10-CM | POA: Diagnosis not present

## 2017-05-25 DIAGNOSIS — Z113 Encounter for screening for infections with a predominantly sexual mode of transmission: Secondary | ICD-10-CM | POA: Diagnosis not present

## 2017-05-31 ENCOUNTER — Emergency Department (INDEPENDENT_AMBULATORY_CARE_PROVIDER_SITE_OTHER)
Admission: EM | Admit: 2017-05-31 | Discharge: 2017-05-31 | Disposition: A | Discharge status: Home / Self Care | Payer: Medicare Other | Source: Home / Self Care | Attending: Family Medicine | Admitting: Family Medicine

## 2017-05-31 ENCOUNTER — Encounter (HOSPITAL_COMMUNITY): Payer: Self-pay | Admitting: Emergency Medicine

## 2017-05-31 ENCOUNTER — Other Ambulatory Visit: Payer: Self-pay

## 2017-05-31 ENCOUNTER — Encounter: Payer: Self-pay | Admitting: Emergency Medicine

## 2017-05-31 ENCOUNTER — Emergency Department (HOSPITAL_COMMUNITY)
Admission: EM | Admit: 2017-05-31 | Discharge: 2017-05-31 | Disposition: A | Discharge status: Home / Self Care | Payer: Medicare Other | Attending: Emergency Medicine | Admitting: Emergency Medicine

## 2017-05-31 DIAGNOSIS — S61412A Laceration without foreign body of left hand, initial encounter: Secondary | ICD-10-CM | POA: Insufficient documentation

## 2017-05-31 DIAGNOSIS — Z5321 Procedure and treatment not carried out due to patient leaving prior to being seen by health care provider: Secondary | ICD-10-CM | POA: Diagnosis not present

## 2017-05-31 DIAGNOSIS — W260XXA Contact with knife, initial encounter: Secondary | ICD-10-CM | POA: Insufficient documentation

## 2017-05-31 DIAGNOSIS — Y9389 Activity, other specified: Secondary | ICD-10-CM | POA: Diagnosis not present

## 2017-05-31 DIAGNOSIS — Y929 Unspecified place or not applicable: Secondary | ICD-10-CM | POA: Insufficient documentation

## 2017-05-31 DIAGNOSIS — Y998 Other external cause status: Secondary | ICD-10-CM | POA: Diagnosis not present

## 2017-05-31 NOTE — Discharge Instructions (Signed)
Change dressing daily and apply Bacitracin ointment to wound.  Keep wound clean and dry.  Return for any signs of infection (or follow-up with family doctor):  Increasing redness, swelling, pain, heat, drainage, etc. Return in 10 days for suture removal.  May take Tylenol or Ibuprofen as needed for pain.
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
## 2017-05-31 NOTE — ED Notes (Signed)
Patient stated she could not wait any longer and left. 

## 2017-05-31 NOTE — ED Provider Notes (Signed)
[name]    CSN: [id number] Arrival date & time: 05/31/17  1053     History   Chief Complaint Chief Complaint  Patient presents with  . Laceration    HPI Ashley Romero is a 21 y.o. female.   Patient lacerated her left hand on a kitchen knife about 3am today.  Her last Tdap was in 2017.   The history is provided by the patient.  Laceration  Location:  Hand Hand laceration location:  L palm Length:  1cm Depth:  Through underlying tissue Quality: straight   Bleeding: controlled   Time since incident:  9 hours Laceration mechanism:  Knife Pain details:    Quality:  Aching   Severity:  Mild   Timing:  Constant   Progression:  Unchanged Foreign body present:  No foreign bodies Ineffective treatments:  None tried Tetanus status:  Up to date Associated symptoms: no focal weakness, no numbness and no swelling     Past Medical History:  Diagnosis Date  . ADHD (attention deficit hyperactivity disorder)   . Anemia   . Anxiety   . Constipation   . Depression   . GERD (gastroesophageal reflux disease)    OTC as needed  . Gestational diabetes   . Hypertension   . Hypertrophy of tonsils and adenoids 08/2013   snores during sleep; had sleep study 03/2013:  AHI 1.9, RDI 2.3  . Irregular menses    Nexplanon implant  . Learning disorder involving mathematics   . Migraines     Patient Active Problem List   Diagnosis Date Noted  . History of severe pre-eclampsia 10/26/2016  . History of gestational diabetes 10/26/2016  . Eczema 06/18/2016  . Morbid obesity with BMI of 50.0-59.9, adult (HCC) 04/21/2016  . Preeclampsia, severe 01/16/2016  . Influenza 09/06/2015  . PCOS (polycystic ovarian syndrome) 06/10/2015  . Gastroenteritis 04/30/2015  . Chronic daily headache 03/22/2015  . Migraine 03/22/2015  . Sleep apnea 03/22/2015  . Bipolar I disorder, most recent episode depressed (HCC) 01/13/2015  . Depressive disorder 01/10/2015  . Acanthosis  nigricans 06/25/2014  . Seborrheic dermatitis 06/01/2014  . Iron deficiency anemia 01/30/2014  . Central auditory processing disorder 01/03/2014  . School problem 08/25/2013  . Acne vulgaris 05/02/2013  . Fatigue 05/02/2013  . Headache(784.0) 11/03/2012  . BMI (body mass index), pediatric, > 99% for age 91/01/2013  . GERD (gastroesophageal reflux disease) 11/03/2012  . Allergic rhinitis 11/03/2012  . 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 Constipation   . ADHD (attention deficit hyperactivity disorder) 06/26/2011  . Mood disorder (HCC) 06/26/2011    Past Surgical History:  Procedure Laterality Date  . CESAREAN SECTION N/A 01/18/2016   Procedure: CESAREAN SECTION;  Surgeon: Myna Hidalgo, DO;  [facility]: [facility];  Service: Obstetrics;  Laterality: N/A;  . TONSILLECTOMY AND ADENOIDECTOMY Bilateral 09/25/2013   Procedure: BILATERAL TONSILLECTOMY AND ADENOIDECTOMY;  Surgeon: Darletta Moll, MD;  Location: [facility];  Service: ENT;  Laterality: Bilateral;  . WISDOM TOOTH EXTRACTION  2015    OB History    Gravida Para Term Preterm AB Living   1 1 1     1    SAB TAB Ectopic Multiple Live Births         0 1       Home Medications    Prior to Admission medications   Medication Sig Start Date End Date Taking? Authorizing Provider  hydrOXYzine (VISTARIL) 25 MG capsule Take 25 mg by mouth 3 (three)  times daily as needed. 11/22/15   [provider]  ibuprofen (ADVIL,MOTRIN) 600 MG tablet TAKE 1 TABLET(600 MG) BY MOUTH EVERY 6 HOURS AS NEEDED 02/25/17   Dove, Myra C, MD  norgestimate-ethinyl estradiol (ORTHO-CYCLEN, 28,) 0.25-35 MG-MCG tablet Take 1 tablet by mouth daily. 10/26/16   Lesly DukesLeggett, Kelly H, MD  ranitidine (ZANTAC) 150 MG tablet Take 150 mg by mouth 2 (two) times daily. 12/26/15   [provider]    Family History Family History  Problem Relation Age of Onset  . Asthma Mother   . Hypertension Mother   . Anesthesia problems Mother        hard to  wake up post-op  . Migraines Mother   . Depression Mother   . Anxiety disorder Mother   . Heart disease Maternal Grandmother   . Migraines Maternal Grandmother   . Bipolar disorder Maternal Aunt   . Schizophrenia Maternal Aunt   . Depression Maternal Aunt   . ADD / ADHD Cousin        Many paternal 1 st cousins have ADD/ADHD    Social History Social History   Tobacco Use  . Smoking status: Current Every Day Smoker    Packs/day: 0.25    Types: Cigarettes  . Smokeless tobacco: Never Used  . Tobacco comment: outside smokers at home  Substance Use Topics  . Alcohol use: No    Alcohol/week: 0.0 oz  . Drug use: No     Allergies   Patient has no known allergies.   Review of Systems Review of Systems  Neurological: Negative for focal weakness.  All other systems reviewed and are negative.    Physical Exam Triage Vital Signs ED Triage Vitals [05/31/17 1125]  Enc Vitals Group     BP 116/81     Pulse Rate 65     Resp      Temp 98.5 F (36.9 C)     Temp src      SpO2 99 %     Weight 246 lb (111.6 kg)     Height      Head Circumference      Peak Flow      Pain Score 3     Pain Loc      Pain Edu?      Excl. in GC?    No data found.  Updated Vital Signs BP 116/81 (BP Location: Right Arm)   Pulse 65   Temp 98.5 F (36.9 C)   Wt 246 lb (111.6 kg)   LMP 05/26/2017   SpO2 99%   BMI 48.04 kg/m   Visual Acuity Right Eye Distance:   Left Eye Distance:   Bilateral Distance:    Right Eye Near:   Left Eye Near:    Bilateral Near:     Physical Exam  Constitutional: She appears well-developed and well-nourished. No distress.  HENT:  Head: Normocephalic.  Eyes: Pupils are equal, round, and reactive to light.  Cardiovascular: Normal rate.  Pulmonary/Chest: Effort normal.  Musculoskeletal:       Left hand: She exhibits tenderness and laceration. She exhibits normal range of motion, no bony tenderness, normal two-point discrimination, normal capillary refill,  no deformity and no swelling.       Hands: Left hand has a simple 1cm long superficial linear laceration on the thenar eminence as noted on diagram.   Thumb has full range of motion.  Distal neurovascular function is intact.   Neurological: She is alert.  Skin: Skin is  warm and dry.  Nursing note and vitals reviewed.    UC Treatments / Results  Labs (all labs ordered are listed, but only abnormal results are displayed) Labs Reviewed - No data to display  EKG  EKG Interpretation None       Radiology No results found.  Procedures Procedures  Laceration Repair Discussed benefits and risks of procedure and verbal consent obtained. Using sterile technique and local anesthesia with 1% lidocaine without epinephrine, cleansed wound with Betadine followed by copious lavage with normal saline.  Wound carefully inspected for debris and foreign bodies; none found.  Wound closed with #3, 5-0 interrupted nylon sutures.  Bacitracin and non-stick sterile dressing applied.  Wound precautions explained to patient.  Return for suture removal in 10 days.   Medications Ordered in UC Medications - No data to display   Initial Impression / Assessment and Plan / UC Course  I have reviewed the triage vital signs and the nursing notes.  Pertinent labs & imaging results that were available during my care of the patient were reviewed by me and considered in my medical decision making (see chart for details).    Change dressing daily and apply Bacitracin ointment to wound.  Keep wound clean and dry.  Return for any signs of infection (or follow-up with family doctor):  Increasing redness, swelling, pain, heat, drainage, etc. Return in 10 days for suture removal.  May take Tylenol or Ibuprofen as needed for pain.    Final Clinical Impressions(s) / UC Diagnoses   Final diagnoses:  Laceration of left hand without foreign body, initial encounter    ED Discharge Orders    None          Lattie HawBeese,  Yousuf Ager A, MD 05/31/17 1153

## 2017-05-31 NOTE — ED Notes (Signed)
Pt left AMA complaining about the wait.

## 2017-05-31 NOTE — ED Notes (Signed)
Pt accidentally cut her hand on a knife when reaching in the sink. She presents with a 1 cm laceration to the left palm with controlled bleeding.

## 2017-06-02 ENCOUNTER — Ambulatory Visit: Payer: Medicare Other | Attending: Family Medicine | Admitting: Audiology

## 2017-06-02 DIAGNOSIS — Z0110 Encounter for examination of ears and hearing without abnormal findings: Secondary | ICD-10-CM | POA: Insufficient documentation

## 2017-06-02 DIAGNOSIS — R94120 Abnormal auditory function study: Secondary | ICD-10-CM | POA: Insufficient documentation

## 2017-06-02 DIAGNOSIS — H93299 Other abnormal auditory perceptions, unspecified ear: Secondary | ICD-10-CM | POA: Insufficient documentation

## 2017-06-02 DIAGNOSIS — Z789 Other specified health status: Secondary | ICD-10-CM | POA: Insufficient documentation

## 2017-06-04 ENCOUNTER — Ambulatory Visit: Payer: Medicare Other | Admitting: Audiology

## 2017-06-04 DIAGNOSIS — Z789 Other specified health status: Secondary | ICD-10-CM | POA: Diagnosis not present

## 2017-06-04 DIAGNOSIS — Z0110 Encounter for examination of ears and hearing without abnormal findings: Secondary | ICD-10-CM

## 2017-06-04 DIAGNOSIS — H93299 Other abnormal auditory perceptions, unspecified ear: Secondary | ICD-10-CM | POA: Diagnosis not present

## 2017-06-04 DIAGNOSIS — R94120 Abnormal auditory function study: Secondary | ICD-10-CM

## 2017-06-04 NOTE — Procedures (Signed)
[facility]  [address]  [place], [place] [address]  [phone]   Audiological Evaluation  Patient Name: Ashley Romero   Status: Outpatient   DOB: 06/29/1995    Diagnosis: Hearing Loss, unspecified laterality                 MRN: [id number] Date:  06/04/2017     Referent: Ashley BlossomKim Brisco, MD    History: Ashley Romero was seen for an audiological evaluation. Ashley Dunningatori was previously seen here on 12/26/2013 with normal hearing thresholds and middle ear function bilaterally. Inner ear function was within normal limits bilaterally, except for weak symmetrical responses at 10kHz only. Word recognition was excellent in quiet but dropped in the left ear to fair while remaining excellent on the right side. 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  Ashley Dunningatori "want to go to pastry school" but is not currently enrolled.  Primary Concern: Ashley Romero states that her "hearing suddenly became worse last year in the right ear only after she gave birth to her daughter".  She has not notice that the hearing has become poorer.  Pain: None Family history of hearing loss:  N   Evaluation: Conventional pure tone audiometry from 250Hz  - 8000Hz  with using insert earphones.  Hearing Thresholds are 5-15 dBHL bilaterally. Reliability is good Speech reception levels (repeating words near threshold) using recorded spondee word lists:  Right ear: 5 dBHL.  Left ear:  5 dBHL Word recognition (at comfortably loud volumes) using recorded word lists at 45 dBHL, in quiet.  Right ear: 96%.  Left ear:   100% Word recognition in minimal background noise:  +5 dBHL  Right ear: 72%                              Left ear:  76%  Tympanometry shows normal middle ear volume, pressure and compliance (Type A) with present and normal ipsilateral acoustic reflexes from 500Hz  - 4000Hz   bilaterally.  Distortion Product Otoacoustic Emissions (DPAOE's), a test of inner ear function was completed from 2000Hz  - 10,000Hz  bilaterally. Responses are symmetrical: present from 1500Hz  - 6000Hz  and weak or absent from 8000Hz  - 10,000Hz  bilaterally. These results are consistent with the 2015 DPOAE results.    CONCLUSION:     Ashley Romero normal hearing thresholds and middle ear function bilaterally. Her inner ear function is the same as the results in 2015 with normal inner ear function throughout most of the range, with weak absent responses in the highest frequencies.  There is no indication that inner ear function or hearing thresholds have changed since 2015.  Word recognition is excellent in each ear in quiet at soft conversational speech levels. In minimal background noise, word recognition drops to fair in each ear.  Compared to the 2015 results the left ear is the same, but the right ear results are somewhat poorer, which may or may not be significant. As discussed with Ashley DunningNatori monitoring of her hearing in 1-2 years is recommended - earlier if there are changes or concerns about her hearing.    RECOMMENDATIONS: 1.   Monitor hearing in 1-2 years because of the weak high frequency inner ear function and slightly reduced word recognition in background noise bilaterally - (earlier if there is any change in hearing or ear pressure).  Jaquelyn Sakamoto L. Kate SableWoodward, Au.D., CCC-A Doctor of Audiology 06/04/2017  cc: Medicine, [facility]

## 2017-06-11 ENCOUNTER — Other Ambulatory Visit: Payer: Self-pay

## 2017-06-11 ENCOUNTER — Emergency Department
Admission: EM | Admit: 2017-06-11 | Discharge: 2017-06-11 | Disposition: A | Discharge status: Home / Self Care | Payer: Medicaid Other | Source: Home / Self Care

## 2017-06-11 ENCOUNTER — Encounter: Payer: Self-pay | Admitting: *Deleted

## 2017-06-11 NOTE — ED Notes (Signed)
#   3 sutures removed without difficulty. #1 steri strip applied. After care instructions given. Return for s/s of infection.

## 2017-06-11 NOTE — ED Triage Notes (Signed)
Pt is here today for suture removal from her LT hand placed on 05/31/2017.

## 2017-06-14 ENCOUNTER — Emergency Department (INDEPENDENT_AMBULATORY_CARE_PROVIDER_SITE_OTHER): Payer: Medicare Other

## 2017-06-14 ENCOUNTER — Encounter: Payer: Self-pay | Admitting: Emergency Medicine

## 2017-06-14 ENCOUNTER — Telehealth: Payer: Self-pay | Admitting: Emergency Medicine

## 2017-06-14 ENCOUNTER — Emergency Department (INDEPENDENT_AMBULATORY_CARE_PROVIDER_SITE_OTHER)
Admission: EM | Admit: 2017-06-14 | Discharge: 2017-06-14 | Disposition: A | Discharge status: Home / Self Care | Payer: Medicare Other | Source: Home / Self Care | Attending: Family Medicine | Admitting: Family Medicine

## 2017-06-14 DIAGNOSIS — S7012XA Contusion of left thigh, initial encounter: Secondary | ICD-10-CM

## 2017-06-14 DIAGNOSIS — M545 Low back pain, unspecified: Secondary | ICD-10-CM

## 2017-06-14 DIAGNOSIS — M5416 Radiculopathy, lumbar region: Secondary | ICD-10-CM | POA: Diagnosis not present
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
## 2017-06-14 NOTE — Discharge Instructions (Signed)
Apply ice pack for 20 to 30 minutes, 3 to 4 times daily  Continue until pain and swelling decrease.  May take Ibuprofen 200mg , 4 tabs every 8 hours with food.  Begin low back range of motion and stretching exercises.

## 2017-06-15 ENCOUNTER — Telehealth: Payer: Self-pay | Admitting: *Deleted

## 2017-06-15 MED ORDER — CYCLOBENZAPRINE HCL 10 MG PO TABS: 10.0000 mg | ORAL_TABLET | Freq: Two times a day (BID) | ORAL | 0 refills | Status: DC | PRN

## 2017-06-15 NOTE — Telephone Encounter (Signed)
Pt called back xray results and AVS instructions given. Rx for Flexeril sent to pt's pharmacy.

## 2017-06-16 DIAGNOSIS — Z711 Person with feared health complaint in whom no diagnosis is made: Secondary | ICD-10-CM | POA: Diagnosis not present

## 2017-11-20 ENCOUNTER — Other Ambulatory Visit: Payer: Self-pay

## 2017-11-20 ENCOUNTER — Emergency Department (INDEPENDENT_AMBULATORY_CARE_PROVIDER_SITE_OTHER)
Admission: EM | Admit: 2017-11-20 | Discharge: 2017-11-20 | Disposition: A | Discharge status: Home / Self Care | Payer: Medicare Other | Source: Home / Self Care | Attending: Emergency Medicine | Admitting: Emergency Medicine

## 2017-11-20 DIAGNOSIS — J209 Acute bronchitis, unspecified: Secondary | ICD-10-CM | POA: Diagnosis not present

## 2017-11-20 DIAGNOSIS — J301 Allergic rhinitis due to pollen: Secondary | ICD-10-CM

## 2017-11-20 MED ORDER — AZITHROMYCIN 250 MG PO TABS: ORAL_TABLET | ORAL | 0 refills | Status: DC

## 2017-11-20 MED ORDER — FLUTICASONE PROPIONATE 50 MCG/ACT NA SUSP: NASAL | 0 refills | Status: DC

## 2017-11-20 MED ORDER — BENZONATATE 200 MG PO CAPS: ORAL_CAPSULE | ORAL | 0 refills | Status: DC

## 2017-11-20 NOTE — ED Provider Notes (Signed)
[name]    CSN: [id number] Arrival date & time: 11/20/17  1800     History   Chief Complaint Chief Complaint  Patient presents with  . Nasal Congestion    HPI Ashley Romero is a 22 y.o. female.   HPI 4 to 5 days of progressively worsening sinus congestion, also seasonal allergy symptoms of sneezing.  Then, 4 days ago started with fever and chills and yellow nasal drainage, then yesterday cough productive of yellow-green sputum, with chest congestion.  No definite shortness of breath or wheezing. Past Medical History:  Diagnosis Date  . ADHD (attention deficit hyperactivity disorder)   . Anemia   . Anxiety   . Constipation   . Depression   . GERD (gastroesophageal reflux disease)    OTC as needed  . Gestational diabetes   . Hypertension   . Hypertrophy of tonsils and adenoids 08/2013   snores during sleep; had sleep study 03/2013:  AHI 1.9, RDI 2.3  . Irregular menses    Nexplanon implant  . Learning disorder involving mathematics   . Migraines     Patient Active Problem List   Diagnosis Date Noted  . History of severe pre-eclampsia 10/26/2016  . History of gestational diabetes 10/26/2016  . Eczema 06/18/2016  . Morbid obesity with BMI of 50.0-59.9, adult (HCC) 04/21/2016  . Preeclampsia, severe 01/16/2016  . Influenza 09/06/2015  . PCOS (polycystic ovarian syndrome) 06/10/2015  . Gastroenteritis 04/30/2015  . Chronic daily headache 03/22/2015  . Migraine 03/22/2015  . Sleep apnea 03/22/2015  . Bipolar I disorder, most recent episode depressed (HCC) 01/13/2015  . Depressive disorder 01/10/2015  . Acanthosis nigricans 06/25/2014  . Seborrheic dermatitis 06/01/2014  . Iron deficiency anemia 01/30/2014  . Central auditory processing disorder 01/03/2014  . School problem 08/25/2013  . Acne vulgaris 05/02/2013  . Fatigue 05/02/2013  . Headache(784.0) 11/03/2012  . BMI (body mass index), pediatric, > 99% for age 86/01/2013  . GERD  (gastroesophageal reflux disease) 11/03/2012  . Allergic rhinitis 11/03/2012  . 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 Constipation   . ADHD (attention deficit hyperactivity disorder) 06/26/2011  . Mood disorder (HCC) 06/26/2011    Past Surgical History:  Procedure Laterality Date  . CESAREAN SECTION N/A 01/18/2016   Procedure: CESAREAN SECTION;  Surgeon: Myna Hidalgo, DO;  [facility];  Service: Obstetrics;  Laterality: N/A;  . TONSILLECTOMY AND ADENOIDECTOMY Bilateral 09/25/2013   Procedure: BILATERAL TONSILLECTOMY AND ADENOIDECTOMY;  Surgeon: Darletta Moll, MD;  Location: [facility];  Service: ENT;  Laterality: Bilateral;  . WISDOM TOOTH EXTRACTION  2015    OB History    Gravida  1   Para  1   Term  1   Preterm      AB      Living  1     SAB      TAB      Ectopic      Multiple  0   Live Births  1           denies chance of pregnancy  Home Medications    Prior to Admission medications   Medication Sig Start Date End Date Taking? Authorizing Provider  azithromycin (ZITHROMAX Z-PAK) 250 MG tablet Take 2 tablets on day one, then 1 tablet daily on days 2 through 5 11/20/17   Lajean Manes, MD  benzonatate (TESSALON) 200 MG capsule Take 1 every 8 hours as needed for cough. 11/20/17   Lajean Manes, MD  cyclobenzaprine (FLEXERIL) 10 MG tablet Take 1 tablet (10 mg total) by mouth 2 (two) times daily as needed. Do not drink, drive or operate machinery while taking this medication. 06/15/17   Lurene Shadow, PA-C  fluticasone Aleda Grana) 50 MCG/ACT nasal spray 1 or 2 sprays each nostril twice a day 11/20/17   Lajean Manes, MD  hydrOXYzine (VISTARIL) 25 MG capsule Take 25 mg by mouth 3 (three) times daily as needed. 11/22/15   [provider]  ibuprofen (ADVIL,MOTRIN) 600 MG tablet TAKE 1 TABLET(600 MG) BY MOUTH EVERY 6 HOURS AS NEEDED 02/25/17   Dove, Myra C, MD  norgestimate-ethinyl estradiol (ORTHO-CYCLEN, 28,) 0.25-35 MG-MCG tablet Take 1  tablet by mouth daily. 10/26/16   Lesly Dukes, MD  ranitidine (ZANTAC) 150 MG tablet Take 150 mg by mouth 2 (two) times daily. 12/26/15   [provider]    Family History Family History  Problem Relation Age of Onset  . Asthma Mother   . Hypertension Mother   . Anesthesia problems Mother        hard to wake up post-op  . Migraines Mother   . Depression Mother   . Anxiety disorder Mother   . Heart disease Maternal Grandmother   . Migraines Maternal Grandmother   . Bipolar disorder Maternal Aunt   . Schizophrenia Maternal Aunt   . Depression Maternal Aunt   . ADD / ADHD Cousin        Many paternal 1 st cousins have ADD/ADHD    Social History Social History   Tobacco Use  . Smoking status: Current Every Day Smoker    Packs/day: 0.25    Types: Cigarettes  . Smokeless tobacco: Never Used  . Tobacco comment: outside smokers at home  Substance Use Topics  . Alcohol use: No    Alcohol/week: 0.0 oz  . Drug use: No  She smokes 1/4 pack daily   Allergies   Patient has no known allergies.   Review of Systems Review of Systems  Constitutional: Positive for fever.  HENT: Positive for sneezing and sore throat (Minimal). Negative for ear pain.   Respiratory: Negative for shortness of breath.   Cardiovascular: Negative for chest pain.  Gastrointestinal: Negative for abdominal distention.  All other systems reviewed and are negative. No abdominal pain, nausea, vomiting.   See also HPI Physical Exam Triage Vital Signs ED Triage Vitals  Enc Vitals Group     BP 11/20/17 1812 116/81     Pulse Rate 11/20/17 1812 91     Resp 11/20/17 1812 18     Temp 11/20/17 1812 98.3 F (36.8 C)     Temp Source 11/20/17 1812 Oral     SpO2 11/20/17 1812 98 %     Weight 11/20/17 1813 238 lb (108 kg)     Height 11/20/17 1813  (1.549 m)     Head Circumference --      Peak Flow --      Pain Score 11/20/17 1813 0     Pain Loc --      Pain Edu? --      Excl. in GC? --     No data found.  Updated Vital Signs BP 116/81 (BP Location: Right Arm)   Pulse 91   Temp 98.3 F (36.8 C) (Oral)   Resp 18   Ht  (1.549 m)   Wt 238 lb (108 kg)   SpO2 98%   BMI 44.97 kg/m   Visual Acuity Right Eye  Distance:   Left Eye Distance:   Bilateral Distance:    Right Eye Near:   Left Eye Near:    Bilateral Near:     Physical Exam  Constitutional: She is oriented to person, place, and time. She appears well-developed and well-nourished. No distress.  HENT:  Head: Normocephalic and atraumatic.  Right Ear: Tympanic membrane, external ear and ear canal normal.  Left Ear: Tympanic membrane, external ear and ear canal normal.  Nose: Mucosal edema and rhinorrhea present. Right sinus exhibits maxillary sinus tenderness. Left sinus exhibits maxillary sinus tenderness.  Mouth/Throat: Oropharynx is clear and moist. No oral lesions. No oropharyngeal exudate.  Eyes: Right eye exhibits no discharge. Left eye exhibits no discharge. No scleral icterus.  Neck: Neck supple.  Cardiovascular: Normal rate, regular rhythm and normal heart sounds.  Pulmonary/Chest: Effort normal. She has no wheezes. She has rhonchi. She has no rales.  Lymphadenopathy:    She has no cervical adenopathy.  Neurological: She is alert and oriented to person, place, and time.  Skin: Skin is warm and dry.  Psychiatric: She has a normal mood and affect.  Nursing note and vitals reviewed.    UC Treatments / Results  Labs (all labs ordered are listed, but only abnormal results are displayed) Labs Reviewed - No data to display  EKG None  Radiology No results found.  Procedures Procedures (including critical care time)  Medications Ordered in UC Medications - No data to display  Initial Impression / Assessment and Plan / UC Course  I have reviewed the triage vital signs and the nursing notes.  Pertinent labs & imaging results that were available during my care of the patient were  reviewed by me and considered in my medical decision making (see chart for details).      Final Clinical Impressions(s) / UC Diagnoses   Final diagnoses:  Acute bronchitis, unspecified organism  Seasonal allergic rhinitis due to pollen  This may have started as viral URI, however, clinically, I am suspicious of secondary bacterial infection causing bronchitis. Treatment options discussed, as well as risks, benefits, alternatives. Patient voiced understanding and agreement with the following plans:   ED Prescriptions    Medication Sig Dispense Auth. Provider   azithromycin (ZITHROMAX Z-PAK) 250 MG tablet Take 2 tablets on day one, then 1 tablet daily on days 2 through 5 1 each Lajean Manes, MD   benzonatate (TESSALON) 200 MG capsule Take 1 every 8 hours as needed for cough. 12 capsule Lajean Manes, MD   fluticasone [facility] 50 MCG/ACT nasal spray 1 or 2 sprays each nostril twice a day 16 g Lajean Manes, MD    Follow-up with your primary care doctor in 5-7 days if not improving, or sooner if symptoms become worse. Precautions discussed. Red flags discussed. Questions invited and answered. Patient voiced understanding and agreement.    Controlled Substance Prescriptions [place] Controlled Substance Registry consulted? Not Applicable   Lajean Manes, MD 11/20/17 Paulo Fruit

## 2017-11-20 NOTE — ED Triage Notes (Signed)
Pt c/o congestion and cough x 3 days. Yellow nasal drainage. Body aches and productive cough.

## 2017-12-07 ENCOUNTER — Telehealth (HOSPITAL_COMMUNITY): Payer: Self-pay | Admitting: Professional

## 2017-12-07 NOTE — Telephone Encounter (Signed)
Pt called to try to schedule an appt. Pt reports "I'm stressed, I feel like my life is out of control. I'm not eating or sleeping." Pt shares this has been going on for "a couple of weeks." Pt reports she did not go to work today due to the stress.  Cln asked pt if she had thoughts of hurting herself or anyone else. Pt reports "I'm not going to hurt myself. If I go to work I might go off if they say something wrong to me. I don't think I would hurt someone but ya know."  When asked if she has a specific plan to hurt someone, pt denies. When asked what kind of services patient is seeking (ie: counseling, psychiatrist, group), pt reports "counseling, psychiatry, medication management." Cln told pt groups are offered and teach coping skills and also provide medication management. Cln shared that individual counselors and psychiatrists are booked out until about the end of July. Pt reports she cannot wait that long and needs to see someone today because she didn't go to work and she will get fired. Cln told pt [facility] [place]. Cln asked pt if she wanted the crisis number in case she needed it. Pt stated "sure" and cln provided it.

## 2018-01-04 ENCOUNTER — Other Ambulatory Visit: Payer: Self-pay

## 2018-01-04 ENCOUNTER — Emergency Department (INDEPENDENT_AMBULATORY_CARE_PROVIDER_SITE_OTHER)
Admission: EM | Admit: 2018-01-04 | Discharge: 2018-01-04 | Disposition: A | Discharge status: Home / Self Care | Payer: Medicare Other | Source: Home / Self Care | Attending: Family Medicine | Admitting: Family Medicine

## 2018-01-04 DIAGNOSIS — N760 Acute vaginitis: Secondary | ICD-10-CM | POA: Diagnosis not present

## 2018-01-04 NOTE — Discharge Instructions (Addendum)
°  Avoid duoching. Avoid sexual activity including intercourse until test results are back.  You may also want to avoid using tampons if you start your menstrual cycle prior to getting test results back as they may cause further irritation until source of symptoms is determined and treated.  Please follow up with family medicine or your gynecologist for further evaluation and treatment of symptoms if not improving in 1 week, sooner if worsening.

## 2018-01-04 NOTE — ED Provider Notes (Signed)
[name]    CSN: [id number] Arrival date & time: 01/04/18  1159     History   Chief Complaint Chief Complaint  Patient presents with  . Vaginal Itching    HPI Ashley Romero is a 22 y.o. female.   HPI  Ashley Romero is a 22 y.o. female presenting to UC with c/o 1 week of vaginal itching and burning.  She reports doing a new yogurt douche just prior to symptoms starting 1 week ago.  She denies any recent unprotected sex but is requesting a wet prep and to be tested for GC/chlamydia. Denies abdominal pain, back pain, nausea or vomiting. LMP about 1 month ago. She is not concerned for pregnancy.    Past Medical History:  Diagnosis Date  . ADHD (attention deficit hyperactivity disorder)   . Anemia   . Anxiety   . Constipation   . Depression   . GERD (gastroesophageal reflux disease)    OTC as needed  . Gestational diabetes   . Hypertension   . Hypertrophy of tonsils and adenoids 08/2013   snores during sleep; had sleep study 03/2013:  AHI 1.9, RDI 2.3  . Irregular menses    Nexplanon implant  . Learning disorder involving mathematics   . Migraines     Patient Active Problem List   Diagnosis Date Noted  . History of severe pre-eclampsia 10/26/2016  . History of gestational diabetes 10/26/2016  . Eczema 06/18/2016  . Morbid obesity with BMI of 50.0-59.9, adult (HCC) 04/21/2016  . Preeclampsia, severe 01/16/2016  . Influenza 09/06/2015  . PCOS (polycystic ovarian syndrome) 06/10/2015  . Gastroenteritis 04/30/2015  . Chronic daily headache 03/22/2015  . Migraine 03/22/2015  . Sleep apnea 03/22/2015  . Bipolar I disorder, most recent episode depressed (HCC) 01/13/2015  . Depressive disorder 01/10/2015  . Acanthosis nigricans 06/25/2014  . Seborrheic dermatitis 06/01/2014  . Iron deficiency anemia 01/30/2014  . Central auditory processing disorder 01/03/2014  . School problem 08/25/2013  . Acne vulgaris 05/02/2013  . Fatigue 05/02/2013  .  Headache(784.0) 11/03/2012  . BMI (body mass index), pediatric, > 99% for age 78/01/2013  . GERD (gastroesophageal reflux disease) 11/03/2012  . Allergic rhinitis 11/03/2012  . 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 Constipation   . ADHD (attention deficit hyperactivity disorder) 06/26/2011  . Mood disorder (HCC) 06/26/2011    Past Surgical History:  Procedure Laterality Date  . CESAREAN SECTION N/A 01/18/2016   Procedure: CESAREAN SECTION;  Surgeon: Myna Hidalgo, DO;  [facility]: [facility];  Service: Obstetrics;  Laterality: N/A;  . TONSILLECTOMY AND ADENOIDECTOMY Bilateral 09/25/2013   Procedure: BILATERAL TONSILLECTOMY AND ADENOIDECTOMY;  Surgeon: Darletta Moll, MD;  Location: [facility];  Service: ENT;  Laterality: Bilateral;  . WISDOM TOOTH EXTRACTION  2015    OB History    Gravida  1   Para  1   Term  1   Preterm      AB      Living  1     SAB      TAB      Ectopic      Multiple  0   Live Births  1            Home Medications    Prior to Admission medications   Medication Sig Start Date End Date Taking? Authorizing Provider  azithromycin (ZITHROMAX Z-PAK) 250 MG tablet Take 2 tablets on day one, then 1 tablet daily on days 2 through 5  11/20/17   Lajean Manes, MD  benzonatate (TESSALON) 200 MG capsule Take 1 every 8 hours as needed for cough. 11/20/17   Lajean Manes, MD  cyclobenzaprine (FLEXERIL) 10 MG tablet Take 1 tablet (10 mg total) by mouth 2 (two) times daily as needed. Do not drink, drive or operate machinery while taking this medication. 06/15/17   Lurene Shadow, PA-C  fluticasone Aleda Grana) 50 MCG/ACT nasal spray 1 or 2 sprays each nostril twice a day 11/20/17   Lajean Manes, MD  hydrOXYzine (VISTARIL) 25 MG capsule Take 25 mg by mouth 3 (three) times daily as needed. 11/22/15   [provider]  ibuprofen (ADVIL,MOTRIN) 600 MG tablet TAKE 1 TABLET(600 MG) BY MOUTH EVERY 6 HOURS AS NEEDED 02/25/17   Dove, Myra C, MD    norgestimate-ethinyl estradiol (ORTHO-CYCLEN, 28,) 0.25-35 MG-MCG tablet Take 1 tablet by mouth daily. 10/26/16   Lesly Dukes, MD  ranitidine (ZANTAC) 150 MG tablet Take 150 mg by mouth 2 (two) times daily. 12/26/15   [provider]    Family History Family History  Problem Relation Age of Onset  . Asthma Mother   . Hypertension Mother   . Anesthesia problems Mother        hard to wake up post-op  . Migraines Mother   . Depression Mother   . Anxiety disorder Mother   . Heart disease Maternal Grandmother   . Migraines Maternal Grandmother   . Bipolar disorder Maternal Aunt   . Schizophrenia Maternal Aunt   . Depression Maternal Aunt   . ADD / ADHD Cousin        Many paternal 1 st cousins have ADD/ADHD    Social History Social History   Tobacco Use  . Smoking status: Current Every Day Smoker    Packs/day: 0.25    Types: Cigarettes  . Smokeless tobacco: Never Used  . Tobacco comment: outside smokers at home  Substance Use Topics  . Alcohol use: No    Alcohol/week: 0.0 oz  . Drug use: No     Allergies   Patient has no known allergies.   Review of Systems Review of Systems  Gastrointestinal: Negative for abdominal pain, diarrhea, nausea and vomiting.  Genitourinary: Positive for dysuria and vaginal pain. Negative for flank pain, frequency, vaginal bleeding and vaginal discharge.     Physical Exam Triage Vital Signs ED Triage Vitals  Enc Vitals Group     BP 01/04/18 1238 112/78     Pulse Rate 01/04/18 1238 88     Resp --      Temp 01/04/18 1238 98.3 F (36.8 C)     Temp Source 01/04/18 1238 Oral     SpO2 01/04/18 1238 100 %     Weight 01/04/18 1239 234 lb (106.1 kg)     Height 01/04/18 1239 5' (1.524 m)     Head Circumference --      Peak Flow --      Pain Score 01/04/18 1239 5     Pain Loc --      Pain Edu? --      Excl. in GC? --    No data found.  Updated Vital Signs BP 112/78 (BP Location: Right Arm)   Pulse 88   Temp 98.3 F  (36.8 C) (Oral)   Ht 5' (1.524 m)   Wt 234 lb (106.1 kg)   SpO2 100%   BMI 45.70 kg/m   Visual Acuity Right Eye Distance:   Left Eye Distance:  Bilateral Distance:    Right Eye Near:   Left Eye Near:    Bilateral Near:     Physical Exam  Constitutional: She is oriented to person, place, and time. She appears well-developed and well-nourished. No distress.  HENT:  Head: Normocephalic and atraumatic.  Eyes: EOM are normal.  Neck: Normal range of motion.  Cardiovascular: Normal rate and regular rhythm.  Pulmonary/Chest: Effort normal. No respiratory distress.  Abdominal: Soft. She exhibits no distension. There is no tenderness.  Genitourinary:  Genitourinary Comments: Chaperoned exam. Normal external exam. No rashes or lesions. Vaginal canal: erythematous, mildly tender. Scant clear to white discharge. No bleeding. No CMT, adnexal tenderness or masses.   Musculoskeletal: Normal range of motion.  Neurological: She is alert and oriented to person, place, and time.  Skin: Skin is warm and dry. She is not diaphoretic.  Psychiatric: She has a normal mood and affect. Her behavior is normal.  Nursing note and vitals reviewed.    UC Treatments / Results  Labs (all labs ordered are listed, but only abnormal results are displayed) Labs Reviewed  C. TRACHOMATIS/N. GONORRHOEAE RNA  WET PREP BY MOLECULAR PROBE    EKG None  Radiology No results found.  Procedures Procedures (including critical care time)  Medications Ordered in UC Medications - No data to display  Initial Impression / Assessment and Plan / UC Course  I have reviewed the triage vital signs and the nursing notes.  Pertinent labs & imaging results that were available during my care of the patient were reviewed by me and considered in my medical decision making (see chart for details).     Swabs for wet prep and GC/chlamydia sent to lab. Home care instructions provided below.   Final Clinical  Impressions(s) / UC Diagnoses   Final diagnoses:  Vaginitis and vulvovaginitis     Discharge Instructions      Avoid duoching. Avoid sexual activity including intercourse until test results are back.  You may also want to avoid using tampons if you start your menstrual cycle prior to getting test results back as they may cause further irritation until source of symptoms is determined and treated.  Please follow up with family medicine or your gynecologist for further evaluation and treatment of symptoms if not improving in 1 week, sooner if worsening.     ED Prescriptions    None     Controlled Substance Prescriptions [place] Controlled Substance Registry consulted? Not Applicable   Rolla Platehelps, Wilberta Dorvil O, PA-C 01/04/18 [id number]

## 2018-01-04 NOTE — ED Triage Notes (Signed)
Pt has been having vaginal burning x 1 week, as well as itching.  Would like a wet prep and GC sent

## 2018-01-05 ENCOUNTER — Telehealth: Payer: Self-pay | Admitting: Emergency Medicine

## 2018-01-05 LAB — WET PREP BY MOLECULAR PROBE
Candida species: DETECTED — AB
MICRO NUMBER:: 90811038
SPECIMEN QUALITY:: ADEQUATE
Trichomonas vaginosis: NOT DETECTED

## 2018-01-05 LAB — C. TRACHOMATIS/N. GONORRHOEAE RNA
C. trachomatis RNA, TMA: NOT DETECTED
N. gonorrhoeae RNA, TMA: NOT DETECTED

## 2018-01-05 NOTE — Telephone Encounter (Signed)
Patient informed of lab results after giving appropriate password. Rxs for Flagyl 500mg  po bid x 7 days; and Diflucan 150mg  po x1 called to [facility].

## 2018-03-30 ENCOUNTER — Other Ambulatory Visit: Payer: Self-pay

## 2018-03-30 ENCOUNTER — Encounter: Payer: Self-pay | Admitting: *Deleted

## 2018-03-30 ENCOUNTER — Emergency Department (INDEPENDENT_AMBULATORY_CARE_PROVIDER_SITE_OTHER)
Admission: EM | Admit: 2018-03-30 | Discharge: 2018-03-30 | Disposition: A | Discharge status: Home / Self Care | Payer: Medicare Other | Source: Home / Self Care | Attending: Emergency Medicine | Admitting: Emergency Medicine

## 2018-03-30 DIAGNOSIS — L509 Urticaria, unspecified: Secondary | ICD-10-CM | POA: Diagnosis not present

## 2018-03-30 MED ORDER — CETIRIZINE HCL 5 MG/5ML PO SOLN
10.0000 mg | Freq: Once | ORAL | Status: AC
Start: 2018-03-30 — End: 2018-03-30
  Administered 2018-03-30: 10 mg via ORAL

## 2018-03-30 MED ORDER — FAMOTIDINE 40 MG/5ML PO SUSR
20.0000 mg | Freq: Once | ORAL | Status: AC
  Administered 2018-03-30: 20 mg via ORAL

## 2018-03-30 MED ORDER — DIPHENHYDRAMINE HCL 25 MG PO CAPS
25.0000 mg | ORAL_CAPSULE | Freq: Once | ORAL | Status: AC
  Administered 2018-03-30: 25 mg via ORAL

## 2018-03-30 NOTE — ED Triage Notes (Signed)
Pt c/o rash on her face, chest and legs x 1 day. Denies any new soaps, lotions, or detergents.

## 2018-03-30 NOTE — Discharge Instructions (Addendum)
Take 1-2 Benadryl every 6 hours. Starting tomorrow take Zyrtec 10 mg 1 a day. Starting tomorrow take pepcid 20 mg 1  day.. Use an Aveeno bath. Please go to the emergency room if you have any swelling of your lips, tongue or difficulty breathing.

## 2018-03-30 NOTE — ED Provider Notes (Signed)
[name]    CSN: [id number] Arrival date & time: 03/30/18  1239     History   Chief Complaint Chief Complaint  Patient presents with  . Rash    HPI Ashley Romero is a 22 y.o. female.  Patient enters with onset last  night of a pruritic rash involving her face and upper trunk. She denies any new food exposures. She has no new  soaps. She does not know of any environmental exposures. She currently does not work.she ate frozen beef and noodles prior to the rash developing. HPI  Past Medical History:  Diagnosis Date  . ADHD (attention deficit hyperactivity disorder)   . Anemia   . Anxiety   . Constipation   . Depression   . GERD (gastroesophageal reflux disease)    OTC as needed  . Gestational diabetes   . Hypertension   . Hypertrophy of tonsils and adenoids 08/2013   snores during sleep; had sleep study 03/2013:  AHI 1.9, RDI 2.3  . Irregular menses    Nexplanon implant  . Learning disorder involving mathematics   . Migraines     Patient Active Problem List   Diagnosis Date Noted  . History of severe pre-eclampsia 10/26/2016  . History of gestational diabetes 10/26/2016  . Eczema 06/18/2016  . Morbid obesity with BMI of 50.0-59.9, adult (HCC) 04/21/2016  . Preeclampsia, severe 01/16/2016  . Influenza 09/06/2015  . PCOS (polycystic ovarian syndrome) 06/10/2015  . Gastroenteritis 04/30/2015  . Chronic daily headache 03/22/2015  . Migraine 03/22/2015  . Sleep apnea 03/22/2015  . Bipolar I disorder, most recent episode depressed (HCC) 01/13/2015  . Depressive disorder 01/10/2015  . Acanthosis nigricans 06/25/2014  . Seborrheic dermatitis 06/01/2014  . Iron deficiency anemia 01/30/2014  . Central auditory processing disorder 01/03/2014  . School problem 08/25/2013  . Acne vulgaris 05/02/2013  . Fatigue 05/02/2013  . Headache(784.0) 11/03/2012  . BMI (body mass index), pediatric, > 99% for age 58/01/2013  . GERD (gastroesophageal reflux disease)  11/03/2012  . Allergic rhinitis 11/03/2012  . 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 Constipation   . ADHD (attention deficit hyperactivity disorder) 06/26/2011  . Mood disorder (HCC) 06/26/2011    Past Surgical History:  Procedure Laterality Date  . CESAREAN SECTION N/A 01/18/2016   Procedure: CESAREAN SECTION;  Surgeon: Myna Hidalgo, DO;  [facility]: [facility];  Service: Obstetrics;  Laterality: N/A;  . TONSILLECTOMY AND ADENOIDECTOMY Bilateral 09/25/2013   Procedure: BILATERAL TONSILLECTOMY AND ADENOIDECTOMY;  Surgeon: Darletta Moll, MD;  Location: [facility];  Service: ENT;  Laterality: Bilateral;  . WISDOM TOOTH EXTRACTION  2015    OB History    Gravida  1   Para  1   Term  1   Preterm      AB      Living  1     SAB      TAB      Ectopic      Multiple  0   Live Births  1            Home Medications    Prior to Admission medications   Not on File    Family History Family History  Problem Relation Age of Onset  . Asthma Mother   . Hypertension Mother   . Anesthesia problems Mother        hard to wake up post-op  . Migraines Mother   . Depression Mother   . Anxiety disorder Mother   .  Heart disease Maternal Grandmother   . Migraines Maternal Grandmother   . Bipolar disorder Maternal Aunt   . Schizophrenia Maternal Aunt   . Depression Maternal Aunt   . ADD / ADHD Cousin        Many paternal 1 st cousins have ADD/ADHD    Social History Social History   Tobacco Use  . Smoking status: Current Every Day Smoker    Packs/day: 0.25    Types: Cigarettes  . Smokeless tobacco: Never Used  . Tobacco comment: outside smokers at home  Substance Use Topics  . Alcohol use: No    Alcohol/week: 0.0 standard drinks  . Drug use: No     Allergies   Patient has no known allergies.   Review of Systems Review of Systems  Constitutional: Negative.   HENT: Negative.   Respiratory: Negative.   Cardiovascular: Negative.     Gastrointestinal: Negative.   Skin: Positive for rash.     Physical Exam Triage Vital Signs ED Triage Vitals  Enc Vitals Group     BP 03/30/18 1302 118/82     Pulse Rate 03/30/18 1302 67     Resp 03/30/18 1302 18     Temp 03/30/18 1302 98.2 F (36.8 C)     Temp Source 03/30/18 1302 Oral     SpO2 03/30/18 1302 100 %     Weight 03/30/18 1303 234 lb (106.1 kg)     Height 03/30/18 1303 5\' 1"  (1.549 m)     Head Circumference --      Peak Flow --      Pain Score 03/30/18 1303 0     Pain Loc --      Pain Edu? --      Excl. in GC? --    No data found.  Updated Vital Signs BP 118/82 (BP Location: Right Arm)   Pulse 67   Temp 98.2 F (36.8 C) (Oral)   Resp 18   Ht 5\' 1"  (1.549 m)   Wt 106.1 kg   LMP 03/10/2018   SpO2 100%   BMI 44.21 kg/m   Visual Acuity Right Eye Distance:   Left Eye Distance:   Bilateral Distance:    Right Eye Near:   Left Eye Near:    Bilateral Near:     Physical Exam  Constitutional: She appears well-developed and well-nourished.  Neck: Normal range of motion. No thyromegaly present.  There is no stridor  Cardiovascular: Normal rate and regular rhythm.  Pulmonary/Chest: Effort normal and breath sounds normal. No respiratory distress.  Skin:  There are isolated raised 0.5-1 cm hive-like areas on her forehead and upper chest.there is no swelling of her lips or tongue.     UC Treatments / Results  Labs (all labs ordered are listed, but only abnormal results are displayed) Labs Reviewed - No data to display  EKG None  Radiology No results found.  Procedures Procedures (including critical care time)  Medications Ordered in UC Medications - No data to display  Initial Impression / Assessment and Plan / UC Course  I have reviewed the triage vital signs and the nursing notes.  Pertinent labs & imaging results that were available during my care of the patient were reviewed by me and considered in my medical decision making (see chart  for details). I do not feel steroids are indicated. She was given 25 of Benadryl along with 10 mg of cetirizine and 20 mg of Pepcid.All were given orally.She was instructed to go to  the emergency room if she had any difficulty bathing.     Final Clinical Impressions(s) / UC Diagnoses   Final diagnoses:  Urticaria     Discharge Instructions     Take 1-2 Benadryl every 6 hours. Starting tomorrow take Zyrtec 10 mg 1 a day. Starting tomorrow take pepcd 20 mg 1  day.. Use an Aveeno bath. Please go to the emergency room if you have any swelling of your lips, tongue or difficulty breathing.    ED Prescriptions    None     Controlled Substance Prescriptions [place] Controlled Substance Registry consulted? Not Applicable   Collene Gobble, MD 03/30/18 1325

## 2018-06-25 ENCOUNTER — Emergency Department (HOSPITAL_COMMUNITY): Payer: Medicare Other

## 2018-06-25 ENCOUNTER — Emergency Department (HOSPITAL_COMMUNITY)
Admission: EM | Admit: 2018-06-25 | Discharge: 2018-06-25 | Disposition: A | Discharge status: Home / Self Care | Payer: Medicare Other | Attending: Emergency Medicine | Admitting: Emergency Medicine

## 2018-06-25 ENCOUNTER — Other Ambulatory Visit: Payer: Self-pay

## 2018-06-25 DIAGNOSIS — R509 Fever, unspecified: Secondary | ICD-10-CM | POA: Diagnosis present

## 2018-06-25 DIAGNOSIS — F17210 Nicotine dependence, cigarettes, uncomplicated: Secondary | ICD-10-CM | POA: Insufficient documentation

## 2018-06-25 DIAGNOSIS — B349 Viral infection, unspecified: Secondary | ICD-10-CM | POA: Diagnosis not present

## 2018-06-25 DIAGNOSIS — I10 Essential (primary) hypertension: Secondary | ICD-10-CM | POA: Diagnosis not present

## 2018-06-25 DIAGNOSIS — F909 Attention-deficit hyperactivity disorder, unspecified type: Secondary | ICD-10-CM | POA: Insufficient documentation

## 2018-06-25 MED ORDER — ONDANSETRON HCL 4 MG PO TABS: 4.0000 mg | ORAL_TABLET | Freq: Four times a day (QID) | ORAL | 0 refills | Status: DC

## 2018-06-25 NOTE — ED Triage Notes (Signed)
Patient states that she was dx with the flu yesterday at her primary care dr. Here today for generalized body aches.

## 2018-06-25 NOTE — ED Provider Notes (Signed)
[facility] EMERGENCY DEPARTMENT Provider Note   CSN: [id number] Arrival date & time: 06/25/18  1932     History   Chief Complaint Chief Complaint  Patient presents with  . Generalized Body Aches    HPI Ashley Romero is a 22 y.o. female.  The history is provided by the patient. No language interpreter was used.     Patient report for the past 2 days she has had fever, chills, congestion, coughing, body aches, and she was seen by her doctor yesterday for her complaint.  States that she tested positive for the flu and was prescribed Tamiflu.  She mentioned taken the medication today but she endorsed nausea and vomiting and decided to come to the ER.  Vomiting has since subsided.  She endorsed some mild abdominal discomfort from vomiting.  She denies any constipation or diarrhea.  Past Medical History:  Diagnosis Date  . ADHD (attention deficit hyperactivity disorder)   . Anemia   . Anxiety   . Constipation   . Depression   . GERD (gastroesophageal reflux disease)    OTC as needed  . Gestational diabetes   . Hypertension   . Hypertrophy of tonsils and adenoids 08/2013   snores during sleep; had sleep study 03/2013:  AHI 1.9, RDI 2.3  . Irregular menses    Nexplanon implant  . Learning disorder involving mathematics   . Migraines     Patient Active Problem List   Diagnosis Date Noted  . History of severe pre-eclampsia 10/26/2016  . History of gestational diabetes 10/26/2016  . Eczema 06/18/2016  . Morbid obesity with BMI of 50.0-59.9, adult (HCC) 04/21/2016  . Preeclampsia, severe 01/16/2016  . Influenza 09/06/2015  . PCOS (polycystic ovarian syndrome) 06/10/2015  . Gastroenteritis 04/30/2015  . Chronic daily headache 03/22/2015  . Migraine 03/22/2015  . Sleep apnea 03/22/2015  . Bipolar I disorder, most recent episode depressed (HCC) 01/13/2015  . Depressive disorder 01/10/2015  . Acanthosis nigricans 06/25/2014  . Seborrheic dermatitis  06/01/2014  . Iron deficiency anemia 01/30/2014  . Central auditory processing disorder 01/03/2014  . School problem 08/25/2013  . Acne vulgaris 05/02/2013  . Fatigue 05/02/2013  . Headache(784.0) 11/03/2012  . BMI (body mass index), pediatric, > 99% for age 31/01/2013  . GERD (gastroesophageal reflux disease) 11/03/2012  . Allergic rhinitis 11/03/2012  . 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 Constipation   . ADHD (attention deficit hyperactivity disorder) 06/26/2011  . Mood disorder (HCC) 06/26/2011    Past Surgical History:  Procedure Laterality Date  . CESAREAN SECTION N/A 01/18/2016   Procedure: CESAREAN SECTION;  Surgeon: Myna Hidalgo, DO;  Location: [facility];  Service: Obstetrics;  Laterality: N/A;  . TONSILLECTOMY AND ADENOIDECTOMY Bilateral 09/25/2013   Procedure: BILATERAL TONSILLECTOMY AND ADENOIDECTOMY;  Surgeon: Darletta Moll, MD;  Location: [facility];  Service: ENT;  Laterality: Bilateral;  . WISDOM TOOTH EXTRACTION  2015     OB History    Gravida  1   Para  1   Term  1   Preterm      AB      Living  1     SAB      TAB      Ectopic      Multiple  0   Live Births  1            Home Medications    Prior to Admission medications   Not on File    Family  History Family History  Problem Relation Age of Onset  . Asthma Mother   . Hypertension Mother   . Anesthesia problems Mother        hard to wake up post-op  . Migraines Mother   . Depression Mother   . Anxiety disorder Mother   . Heart disease Maternal Grandmother   . Migraines Maternal Grandmother   . Bipolar disorder Maternal Aunt   . Schizophrenia Maternal Aunt   . Depression Maternal Aunt   . ADD / ADHD Cousin        Many paternal 1 st cousins have ADD/ADHD    Social History Social History   Tobacco Use  . Smoking status: Current Every Day Smoker    Packs/day: 0.25    Types: Cigarettes  . Smokeless tobacco: Never Used  . Tobacco comment: outside  smokers at home  Substance Use Topics  . Alcohol use: No    Alcohol/week: 0.0 standard drinks  . Drug use: No     Allergies   Patient has no known allergies.   Review of Systems Review of Systems  All other systems reviewed and are negative.    Physical Exam Updated Vital Signs BP 130/89   Pulse 79   Temp 100 F (37.8 C) (Oral)   Resp 16   Ht 5' (1.524 m)   Wt 109.3 kg   LMP 05/29/2018   SpO2 98%   BMI 47.07 kg/m   Physical Exam Vitals signs and nursing note reviewed.  Constitutional:      General: She is not in acute distress.    Appearance: She is well-developed.  HENT:     Head: Atraumatic.     Right Ear: Tympanic membrane normal.     Left Ear: Tympanic membrane normal.     Nose: Nose normal.     Mouth/Throat:     Mouth: Mucous membranes are moist.  Eyes:     Conjunctiva/sclera: Conjunctivae normal.  Neck:     Musculoskeletal: Neck supple. No neck rigidity.  Cardiovascular:     Rate and Rhythm: Normal rate and regular rhythm.  Pulmonary:     Effort: Pulmonary effort is normal.     Breath sounds: Normal breath sounds.  Abdominal:     Palpations: Abdomen is soft.     Tenderness: There is no abdominal tenderness. There is no guarding.  Skin:    Findings: No rash.  Neurological:     Mental Status: She is alert.      ED Treatments / Results  Labs (all labs ordered are listed, but only abnormal results are displayed) Labs Reviewed - No data to display  EKG None  Radiology Dg Chest 2 View  Result Date: 06/25/2018 CLINICAL DATA:  Shortness of breath, chest pain. EXAM: CHEST - 2 VIEW COMPARISON:  Radiographs of July 31, 2016. FINDINGS: The heart size and mediastinal contours are within normal limits. Both lungs are clear. No pneumothorax or pleural effusion is noted. The visualized skeletal structures are unremarkable. IMPRESSION: No active cardiopulmonary disease. Electronically Signed   By: Lupita RaiderJames  Green Jr, M.D.   On: 06/25/2018 21:03     Procedures Procedures (including critical care time)  Medications Ordered in ED Medications - No data to display   Initial Impression / Assessment and Plan / ED Course  I have reviewed the triage vital signs and the nursing notes.  Pertinent labs & imaging results that were available during my care of the patient were reviewed by me and considered in my  medical decision making (see chart for details).     BP 130/89   Pulse 79   Temp 100 F (37.8 C) (Oral)   Resp 16   Ht 5' (1.524 m)   Wt 109.3 kg   LMP 05/29/2018   SpO2 98%   BMI 47.07 kg/m    Final Clinical Impressions(s) / ED Diagnoses   Final diagnoses:  Viral syndrome    ED Discharge Orders         Ordered    ondansetron (ZOFRAN) 4 MG tablet  Every 6 hours     06/25/18 2222         10:19 PM Patient was diagnosed with the flu yesterday was given Tamiflu.  Presenting today complaining of nausea vomiting after taking the medication.  She is otherwise well-appearing, abdomen is soft and nontender on exam, vital signs stable.  Suspect nausea vomiting could be a side effect of Tamiflu.  Will discharge home with antinausea medication.  Patient have the options of taking the medication if she wants but she certainly does not have to continue with the Tamiflu as it may not provide any significant improvement and should she is not at risk of complication from the flu.  Patient currently tolerates p.o. and stable for discharge.   Fayrene Helperran, Tabor Denham, PA-C 06/25/18 2224    Terrilee FilesButler, Michael C, MD 06/26/18 [phone]

## 2018-06-25 NOTE — Discharge Instructions (Signed)
You may take zofran as needed for nausea.  You may discontinue tamiflu if it causes nausea/vomiting.  Drink plenty of fluid, take tylenol and/or ibuprofen as needed for pain.

## 2018-06-25 NOTE — ED Notes (Signed)
Patient verbalizes understanding of discharge instructions. Opportunity for questioning and answers were provided. Armband removed by staff, pt discharged from ED.  

## 2018-07-19 ENCOUNTER — Encounter: Payer: Self-pay | Admitting: *Deleted

## 2018-07-19 ENCOUNTER — Other Ambulatory Visit: Payer: Self-pay

## 2018-07-19 ENCOUNTER — Emergency Department (INDEPENDENT_AMBULATORY_CARE_PROVIDER_SITE_OTHER): Payer: 59

## 2018-07-19 ENCOUNTER — Emergency Department (INDEPENDENT_AMBULATORY_CARE_PROVIDER_SITE_OTHER)
Admission: EM | Admit: 2018-07-19 | Discharge: 2018-07-19 | Disposition: A | Discharge status: Home / Self Care | Payer: 59 | Source: Home / Self Care | Attending: Family Medicine | Admitting: Family Medicine

## 2018-07-19 DIAGNOSIS — S92512A Displaced fracture of proximal phalanx of left lesser toe(s), initial encounter for closed fracture: Secondary | ICD-10-CM | POA: Diagnosis not present

## 2018-07-19 DIAGNOSIS — S92344A Nondisplaced fracture of fourth metatarsal bone, right foot, initial encounter for closed fracture: Secondary | ICD-10-CM

## 2018-07-19 DIAGNOSIS — W2203XA Walked into furniture, initial encounter: Secondary | ICD-10-CM

## 2018-07-19 MED ORDER — TRAMADOL HCL 50 MG PO TABS: 50.0000 mg | ORAL_TABLET | Freq: Four times a day (QID) | ORAL | 0 refills | Status: DC | PRN

## 2018-07-19 MED ORDER — IBUPROFEN 600 MG PO TABS
600.0000 mg | ORAL_TABLET | Freq: Once | ORAL | Status: AC
  Administered 2018-07-19: 600 mg via ORAL

## 2018-07-19 NOTE — Discharge Instructions (Signed)
It is recommend you apply little to no weight on your Left foot until follow up with Sports Medicine for ongoing care of the fracture.  You may apply a cool compress and elevate your foot 2-3 times daily for 15-20 minutes at a time to limit swelling and pain.  Tramadol is strong pain medication. While taking, do not drink alcohol, drive, or perform any other activities that requires focus while taking these medications.   You may take 500mg  acetaminophen every 4-6 hours or in combination with ibuprofen 400-600mg  every 6-8 hours as needed for pain and inflammation.   Please call today or tomorrow to schedule a follow up appointment with Sports Medicine later this week or early next week for ongoing care of your broken toe/foot.

## 2018-07-19 NOTE — ED Provider Notes (Signed)
[facility]    CSN: [id number] Arrival date & time: 07/19/18  [id number]     History   Chief Complaint Chief Complaint  Patient presents with  . Toe Injury    HPI Ashley Romero is a 23 y.o. female.   HPI  Ashley Romero is a 23 y.o. female presenting to UC with c/o Left foot/toe pain after accidentally hitting her foot on an ottoman last night rushing to go into another room.  Pain is aching and throbbing, limited ROM due to pain. Unable to bear weight.  She took Advil last night with moderate relief. No prior fracture to same foot.   Past Medical History:  Diagnosis Date  . ADHD (attention deficit hyperactivity disorder)   . Anemia   . Anxiety   . Constipation   . Depression   . GERD (gastroesophageal reflux disease)    OTC as needed  . Gestational diabetes   . Hypertension   . Hypertrophy of tonsils and adenoids 08/2013   snores during sleep; had sleep study 03/2013:  AHI 1.9, RDI 2.3  . Irregular menses    Nexplanon implant  . Learning disorder involving mathematics   . Migraines     Patient Active Problem List   Diagnosis Date Noted  . History of severe pre-eclampsia 10/26/2016  . History of gestational diabetes 10/26/2016  . Eczema 06/18/2016  . Morbid obesity with BMI of 50.0-59.9, adult (HCC) 04/21/2016  . Preeclampsia, severe 01/16/2016  . Influenza 09/06/2015  . PCOS (polycystic ovarian syndrome) 06/10/2015  . Gastroenteritis 04/30/2015  . Chronic daily headache 03/22/2015  . Migraine 03/22/2015  . Sleep apnea 03/22/2015  . Bipolar I disorder, most recent episode depressed (HCC) 01/13/2015  . Depressive disorder 01/10/2015  . Acanthosis nigricans 06/25/2014  . Seborrheic dermatitis 06/01/2014  . Iron deficiency anemia 01/30/2014  . Central auditory processing disorder 01/03/2014  . School problem 08/25/2013  . Acne vulgaris 05/02/2013  . Fatigue 05/02/2013  . Headache(784.0) 11/03/2012  . BMI (body mass index), pediatric, > 99% for  age 58/01/2013  . GERD (gastroesophageal reflux disease) 11/03/2012  . Allergic rhinitis 11/03/2012  . 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 Constipation   . ADHD (attention deficit hyperactivity disorder) 06/26/2011  . Mood disorder (HCC) 06/26/2011    Past Surgical History:  Procedure Laterality Date  . CESAREAN SECTION N/A 01/18/2016   Procedure: CESAREAN SECTION;  Surgeon: Myna HidalgoJennifer Ozan, DO;  [facility];  Service: Obstetrics;  Laterality: N/A;  . TONSILLECTOMY AND ADENOIDECTOMY Bilateral 09/25/2013   Procedure: BILATERAL TONSILLECTOMY AND ADENOIDECTOMY;  Surgeon: Darletta MollSui W Teoh, MD;  Location: [facility];  Service: ENT;  Laterality: Bilateral;  . WISDOM TOOTH EXTRACTION  2015    OB History    Gravida  1   Para  1   Term  1   Preterm      AB      Living  1     SAB      TAB      Ectopic      Multiple  0   Live Births  1            Home Medications    Prior to Admission medications   Medication Sig Start Date End Date Taking? Authorizing Provider  traMADol (ULTRAM) 50 MG tablet Take 1 tablet (50 mg total) by mouth every 6 (six) hours as needed. 07/19/18   Lurene ShadowPhelps, Jen Eppinger O, PA-C    Family History Family History  Problem Relation Age of Onset  . Asthma Mother   . Hypertension Mother   . Anesthesia problems Mother        hard to wake up post-op  . Migraines Mother   . Depression Mother   . Anxiety disorder Mother   . Heart disease Maternal Grandmother   . Migraines Maternal Grandmother   . Bipolar disorder Maternal Aunt   . Schizophrenia Maternal Aunt   . Depression Maternal Aunt   . ADD / ADHD Cousin        Many paternal 1 st cousins have ADD/ADHD    Social History Social History   Tobacco Use  . Smoking status: Current Every Day Smoker    Packs/day: 0.25    Types: Cigarettes  . Smokeless tobacco: Never Used  . Tobacco comment: outside smokers at home  Substance Use Topics  . Alcohol use: No    Alcohol/week: 0.0  standard drinks  . Drug use: No     Allergies   Patient has no known allergies.   Review of Systems Review of Systems  Musculoskeletal: Positive for arthralgias, gait problem and joint swelling.  Skin: Positive for color change. Negative for wound.     Physical Exam Triage Vital Signs ED Triage Vitals  Enc Vitals Group     BP 07/19/18 0934 (!) 143/84     Pulse Rate 07/19/18 0934 72     Resp 07/19/18 0934 14     Temp --      Temp src --      SpO2 07/19/18 0934 99 %     Weight 07/19/18 0935 240 lb (108.9 kg)     Height --      Head Circumference --      Peak Flow --      Pain Score 07/19/18 0935 5     Pain Loc --      Pain Edu? --      Excl. in GC? --    No data found.  Updated Vital Signs BP (!) 143/84 (BP Location: Right Arm)   Pulse 72   Resp 14   Wt 240 lb (108.9 kg)   LMP 07/10/2017   SpO2 99%   BMI 46.87 kg/m   Visual Acuity Right Eye Distance:   Left Eye Distance:   Bilateral Distance:    Right Eye Near:   Left Eye Near:    Bilateral Near:     Physical Exam Vitals signs and nursing note reviewed.  Constitutional:      Appearance: She is well-developed.  HENT:     Head: Normocephalic and atraumatic.  Neck:     Musculoskeletal: Normal range of motion.  Cardiovascular:     Rate and Rhythm: Normal rate.  Pulmonary:     Effort: Pulmonary effort is normal.  Musculoskeletal:        General: Swelling and tenderness present.     Comments: Left foot: mild edema at base of 4th and 5th toes. Tender. Limited ROM.  No tenderness to ankle, full ROM  Skin:    General: Skin is warm and dry.     Comments: Left foot: skin in tact. Faint ecchymosis to base of 4th toe.   Neurological:     Mental Status: She is alert and oriented to person, place, and time.  Psychiatric:        Behavior: Behavior normal.      UC Treatments / Results  Labs (all labs ordered are listed, but only abnormal results are  displayed) Labs Reviewed - No data to  display  EKG None  Radiology Dg Foot Complete Left  Result Date: 07/19/2018 CLINICAL DATA:  Injury.  Pain, particularly fourth and fifth digits. EXAM: LEFT FOOT - COMPLETE 3+ VIEW COMPARISON:  No recent prior. FINDINGS: An oblique fracture of the proximal phalanx of left fourth digit noted. No other focal abnormality identified. No radiopaque foreign body. IMPRESSION: Oblique fracture of the proximal portion of the left fourth digit. Electronically Signed   By: Maisie Fushomas  Register   On: 07/19/2018 10:14    Procedures Procedures (including critical care time)  Medications Ordered in UC Medications  ibuprofen (ADVIL,MOTRIN) tablet 600 mg (600 mg Oral Given 07/19/18 0946)    Initial Impression / Assessment and Plan / UC Course  I have reviewed the triage vital signs and the nursing notes.  Pertinent labs & imaging results that were available during my care of the patient were reviewed by me and considered in my medical decision making (see chart for details).     Discussed imaging with pt Pt placed in post-op shoe, provided crutches Home info packet provided.  Final Clinical Impressions(s) / UC Diagnoses   Final diagnoses:  Closed nondisplaced fracture of fourth metatarsal bone of right foot, initial encounter     Discharge Instructions     It is recommend you apply little to no weight on your Left foot until follow up with Sports Medicine for ongoing care of the fracture.  You may apply a cool compress and elevate your foot 2-3 times daily for 15-20 minutes at a time to limit swelling and pain.  Tramadol is strong pain medication. While taking, do not drink alcohol, drive, or perform any other activities that requires focus while taking these medications.   You may take 500mg  acetaminophen every 4-6 hours or in combination with ibuprofen 400-600mg  every 6-8 hours as needed for pain and inflammation.   Please call today or tomorrow to schedule a follow up appointment with Sports  Medicine later this week or early next week for ongoing care of your broken toe/foot.     ED Prescriptions    Medication Sig Dispense Auth. Provider   traMADol (ULTRAM) 50 MG tablet Take 1 tablet (50 mg total) by mouth every 6 (six) hours as needed. 15 tablet Lurene ShadowPhelps, Bevelyn Arriola O, PA-C     Controlled Substance Prescriptions [place] Controlled Substance Registry consulted? Yes, I have consulted the [place] Controlled Substances Registry for this patient, and feel the risk/benefit ratio today is favorable for proceeding with this prescription for a controlled substance.   Lurene Shadowhelps, Chester Romero O, [place] JerseyPA-C 07/19/18 1401

## 2018-07-19 NOTE — ED Triage Notes (Signed)
Patient reports hitting left foot/toes on ottoman last night, bending them back. Took Advil last night but none today.

## 2019-06-15 ENCOUNTER — Emergency Department (INDEPENDENT_AMBULATORY_CARE_PROVIDER_SITE_OTHER)
Admission: EM | Admit: 2019-06-15 | Discharge: 2019-06-15 | Disposition: A | Discharge status: Home / Self Care | Payer: 59 | Source: Home / Self Care

## 2019-06-15 ENCOUNTER — Emergency Department (INDEPENDENT_AMBULATORY_CARE_PROVIDER_SITE_OTHER): Payer: 59

## 2019-06-15 ENCOUNTER — Other Ambulatory Visit: Payer: Self-pay

## 2019-06-15 ENCOUNTER — Encounter: Payer: Self-pay | Admitting: Emergency Medicine

## 2019-06-15 DIAGNOSIS — M25572 Pain in left ankle and joints of left foot: Secondary | ICD-10-CM

## 2019-06-15 NOTE — Discharge Instructions (Addendum)
Return if any problems.

## 2019-06-15 NOTE — ED Triage Notes (Signed)
Left ankle pain. States she twisted it while playing with her kids last night.

## 2019-06-15 NOTE — ED Provider Notes (Addendum)
[facility]    CSN: [id number] Arrival date & time: 06/15/19  1405      History   Chief Complaint Chief Complaint  Patient presents with  . Ankle Pain    HPI Ashley Romero is a 23 y.o. female.   The history is provided by the patient. No language interpreter was used.  Ankle Pain Location:  Ankle Injury: no   Ankle location:  R ankle Pain details:    Quality:  Aching   Radiates to:  Does not radiate   Severity:  Moderate   Onset quality:  Gradual   Timing:  Constant   Progression:  Worsening Chronicity:  New Relieved by:  Nothing Worsened by:  Nothing Ineffective treatments:  None tried Associated symptoms: decreased ROM    Pt complains of pain in ankle with walking after falling.  Past Medical History:  Diagnosis Date  . ADHD (attention deficit hyperactivity disorder)   . Anemia   . Anxiety   . Constipation   . Depression   . GERD (gastroesophageal reflux disease)    OTC as needed  . Gestational diabetes   . Hypertension   . Hypertrophy of tonsils and adenoids 08/2013   snores during sleep; had sleep study 03/2013:  AHI 1.9, RDI 2.3  . Irregular menses    Nexplanon implant  . Learning disorder involving mathematics   . Migraines     Patient Active Problem List   Diagnosis Date Noted  . History of severe pre-eclampsia 10/26/2016  . History of gestational diabetes 10/26/2016  . Eczema 06/18/2016  . Morbid obesity with BMI of 50.0-59.9, adult (HCC) 04/21/2016  . Preeclampsia, severe 01/16/2016  . Influenza 09/06/2015  . PCOS (polycystic ovarian syndrome) 06/10/2015  . Gastroenteritis 04/30/2015  . Chronic daily headache 03/22/2015  . Migraine 03/22/2015  . Sleep apnea 03/22/2015  . Bipolar I disorder, most recent episode depressed (HCC) 01/13/2015  . Depressive disorder 01/10/2015  . Acanthosis nigricans 06/25/2014  . Seborrheic dermatitis 06/01/2014  . Iron deficiency anemia 01/30/2014  . Central auditory processing disorder  01/03/2014  . School problem 08/25/2013  . Acne vulgaris 05/02/2013  . Fatigue 05/02/2013  . Headache(784.0) 11/03/2012  . BMI (body mass index), pediatric, > 99% for age 36/01/2013  . GERD (gastroesophageal reflux disease) 11/03/2012  . Allergic rhinitis 11/03/2012  . 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 Constipation   . ADHD (attention deficit hyperactivity disorder) 06/26/2011  . Mood disorder (HCC) 06/26/2011    Past Surgical History:  Procedure Laterality Date  . CESAREAN SECTION N/A 01/18/2016   Procedure: CESAREAN SECTION;  Surgeon: Myna HidalgoJennifer Ozan, DO;  [facility]: [facility];  Service: Obstetrics;  Laterality: N/A;  . TONSILLECTOMY AND ADENOIDECTOMY Bilateral 09/25/2013   Procedure: BILATERAL TONSILLECTOMY AND ADENOIDECTOMY;  Surgeon: Darletta MollSui W Teoh, MD;  Location: [facility];  Service: ENT;  Laterality: Bilateral;  . WISDOM TOOTH EXTRACTION  2015    OB History    Gravida  1   Para  1   Term  1   Preterm      AB      Living  1     SAB      TAB      Ectopic      Multiple  0   Live Births  1            Home Medications    Prior to Admission medications   Medication Sig Start Date End Date Taking? Authorizing Provider  hydrOXYzine (  VISTARIL) 25 MG capsule Take by mouth. 05/03/18  Yes [provider]  ibuprofen (ADVIL) 600 MG tablet TAKE 1 TABLET(600 MG) BY MOUTH EVERY 6 HOURS AS NEEDED FOR PAIN 01/24/19  Yes [provider]  lisdexamfetamine (VYVANSE) 30 MG capsule Take by mouth. 08/24/18  Yes [provider]  traZODone (DESYREL) 100 MG tablet Take by mouth. 04/29/18  Yes [provider]  carbamazepine (TEGRETOL XR) 100 MG 12 hr tablet Take 100 mg by mouth at bedtime. 05/11/19   [provider]  FLUoxetine (PROZAC) 20 MG capsule Take 20 mg by mouth daily. 05/09/19   [provider]  traMADol (ULTRAM) 50 MG tablet Take 1 tablet (50 mg total) by mouth every 6 (six) hours as needed. 07/19/18    Lurene Shadow, PA-C    Family History Family History  Problem Relation Age of Onset  . Asthma Mother   . Hypertension Mother   . Anesthesia problems Mother        hard to wake up post-op  . Migraines Mother   . Depression Mother   . Anxiety disorder Mother   . Heart disease Maternal Grandmother   . Migraines Maternal Grandmother   . Bipolar disorder Maternal Aunt   . Schizophrenia Maternal Aunt   . Depression Maternal Aunt   . ADD / ADHD Cousin        Many paternal 1 st cousins have ADD/ADHD    Social History Social History   Tobacco Use  . Smoking status: Current Every Day Smoker    Packs/day: 0.25    Types: Cigarettes  . Smokeless tobacco: Never Used  . Tobacco comment: outside smokers at home  Substance Use Topics  . Alcohol use: No    Alcohol/week: 0.0 standard drinks  . Drug use: No     Allergies   Patient has no known allergies.   Review of Systems Review of Systems  All other systems reviewed and are negative.    Physical Exam Triage Vital Signs ED Triage Vitals  Enc Vitals Group     BP 06/15/19 1422 112/70     Pulse Rate 06/15/19 1422 76     Resp --      Temp 06/15/19 1422 98.7 F (37.1 C)     Temp Source 06/15/19 1422 Oral     SpO2 06/15/19 1422 98 %     Weight 06/15/19 1423 238 lb (108 kg)     Height --      Head Circumference --      Peak Flow --      Pain Score 06/15/19 1423 8     Pain Loc --      Pain Edu? --      Excl. in GC? --    No data found.  Updated Vital Signs BP 112/70 (BP Location: Right Arm)   Pulse 76   Temp 98.7 F (37.1 C) (Oral)   Wt 108 kg   LMP 05/12/2019   SpO2 98%   BMI 46.48 kg/m   Visual Acuity Right Eye Distance:   Left Eye Distance:   Bilateral Distance:    Right Eye Near:   Left Eye Near:    Bilateral Near:     Physical Exam Vitals and nursing note reviewed.  Constitutional:      Appearance: She is well-developed.  HENT:     Head: Normocephalic.  Pulmonary:     Effort: Pulmonary  effort is normal.  Abdominal:     General: There is  no distension.  Musculoskeletal:        General: Swelling and tenderness present.     Cervical back: Normal range of motion.     Comments: Swollen tender left ankle, nv and ns intact  Skin:    General: Skin is warm.  Neurological:     Mental Status: She is alert and oriented to person, place, and time.  Psychiatric:        Mood and Affect: Mood normal.      UC Treatments / Results  Labs (all labs ordered are listed, but only abnormal results are displayed) Labs Reviewed - No data to display  EKG   Radiology DG Ankle Complete Left  Result Date: 06/15/2019 CLINICAL DATA:  Left ankle pain after injury yesterday. EXAM: LEFT ANKLE COMPLETE - 3+ VIEW COMPARISON:  July 19, 2018. FINDINGS: There is no evidence of fracture, dislocation, or joint effusion. There is no evidence of arthropathy or other focal bone abnormality. Soft tissues are unremarkable. IMPRESSION: Negative. Electronically Signed   By: Marijo Conception M.D.   On: 06/15/2019 14:59    Procedures Procedures (including critical care time)  Medications Ordered in UC Medications - No data to display  Initial Impression / Assessment and Plan / UC Course  I have reviewed the triage vital signs and the nursing notes.  Pertinent labs & imaging results that were available during my care of the patient were reviewed by me and considered in my medical decision making (see chart for details).     MDM   Xrays reviewed and pt advised of reults.  Pt advised RICE, ace wrap Final Clinical Impressions(s) / UC Diagnoses   Final diagnoses:  Acute left ankle pain     Discharge Instructions     Return if any problems.    ED Prescriptions    None     PDMP not reviewed this encounter.  An After Visit Summary was printed and given to the patient.    Fransico Meadow, PA-C 06/15/19 1540    Fransico Meadow, [place] 06/15/19 1541

## 2019-07-11 ENCOUNTER — Telehealth: Payer: 59

## 2019-07-11 ENCOUNTER — Encounter (HOSPITAL_COMMUNITY): Payer: Self-pay

## 2019-09-15 ENCOUNTER — Emergency Department (HOSPITAL_COMMUNITY)
Admission: EM | Admit: 2019-09-15 | Discharge: 2019-09-15 | Disposition: A | Discharge status: Home / Self Care | Payer: 59 | Attending: Emergency Medicine | Admitting: Emergency Medicine

## 2019-09-15 ENCOUNTER — Other Ambulatory Visit: Payer: Self-pay

## 2019-09-15 ENCOUNTER — Encounter (HOSPITAL_COMMUNITY): Payer: Self-pay | Admitting: *Deleted

## 2019-09-15 DIAGNOSIS — S0990XA Unspecified injury of head, initial encounter: Secondary | ICD-10-CM | POA: Diagnosis present

## 2019-09-15 DIAGNOSIS — Y999 Unspecified external cause status: Secondary | ICD-10-CM | POA: Diagnosis not present

## 2019-09-15 DIAGNOSIS — I10 Essential (primary) hypertension: Secondary | ICD-10-CM | POA: Insufficient documentation

## 2019-09-15 DIAGNOSIS — Z79899 Other long term (current) drug therapy: Secondary | ICD-10-CM | POA: Diagnosis not present

## 2019-09-15 DIAGNOSIS — Y93I9 Activity, other involving external motion: Secondary | ICD-10-CM | POA: Insufficient documentation

## 2019-09-15 DIAGNOSIS — Y929 Unspecified place or not applicable: Secondary | ICD-10-CM | POA: Insufficient documentation

## 2019-09-15 DIAGNOSIS — F17210 Nicotine dependence, cigarettes, uncomplicated: Secondary | ICD-10-CM | POA: Diagnosis not present

## 2019-09-15 DIAGNOSIS — F909 Attention-deficit hyperactivity disorder, unspecified type: Secondary | ICD-10-CM | POA: Diagnosis not present

## 2019-09-15 MED ORDER — ACETAMINOPHEN 325 MG PO TABS
650.0000 mg | ORAL_TABLET | Freq: Once | ORAL | Status: AC
  Administered 2019-09-15: 22:00:00 650 mg via ORAL
  Filled 2019-09-15: qty 2

## 2019-09-15 NOTE — Discharge Instructions (Addendum)
Take Tylenol as needed as directed.  Recheck with your primary care provider.  Return to ER for worsening or concerning symptoms.
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
## 2019-09-15 NOTE — ED Notes (Signed)
Patient verbalizes understanding of discharge instructions. Opportunity for questioning and answers were provided. Armband removed by staff, pt discharged from ED.  

## 2019-09-18 ENCOUNTER — Ambulatory Visit
Admission: EM | Admit: 2019-09-18 | Discharge: 2019-09-18 | Disposition: A | Discharge status: Home / Self Care | Payer: 59 | Attending: Emergency Medicine | Admitting: Emergency Medicine

## 2019-09-18 ENCOUNTER — Encounter: Payer: Self-pay | Admitting: Emergency Medicine

## 2019-09-18 ENCOUNTER — Other Ambulatory Visit: Payer: Self-pay

## 2019-09-18 DIAGNOSIS — G43109 Migraine with aura, not intractable, without status migrainosus: Secondary | ICD-10-CM | POA: Diagnosis not present

## 2019-09-18 DIAGNOSIS — M7918 Myalgia, other site: Secondary | ICD-10-CM

## 2019-09-18 MED ORDER — ONDANSETRON 4 MG PO TBDP
4.0000 mg | ORAL_TABLET | Freq: Once | ORAL | Status: AC
  Administered 2019-09-18: 4 mg via ORAL

## 2019-09-18 MED ORDER — KETOROLAC TROMETHAMINE 30 MG/ML IJ SOLN
30.0000 mg | Freq: Once | INTRAMUSCULAR | Status: AC
  Administered 2019-09-18: 30 mg via INTRAMUSCULAR

## 2019-09-18 MED ORDER — DEXAMETHASONE SODIUM PHOSPHATE 10 MG/ML IJ SOLN
10.0000 mg | Freq: Once | INTRAMUSCULAR | Status: AC
  Administered 2019-09-18: 10 mg via INTRAMUSCULAR

## 2019-09-18 MED ORDER — NAPROXEN 500 MG PO TABS: 500.0000 mg | ORAL_TABLET | Freq: Two times a day (BID) | ORAL | 0 refills | Status: DC

## 2019-09-18 MED ORDER — CYCLOBENZAPRINE HCL 5 MG PO TABS: 5.0000 mg | ORAL_TABLET | Freq: Two times a day (BID) | ORAL | 0 refills | Status: AC | PRN

## 2019-09-18 NOTE — Discharge Instructions (Addendum)
Recommend RICE: rest, ice, compression, elevation as needed for pain.   Heat therapy (hot compress, warm wash red, hot showers, etc.) can help relax muscles and soothe muscle aches. Cold therapy (ice packs) can be used to help swelling both after injury and after prolonged use of areas of chronic pain/aches.  For pain: take naproxen as discussed.  May take tylenol as needed.  May take muscle relaxer as needed for severe pain / spasm.  (This medication may cause you to become tired so it is important you do not drink alcohol or operate heavy machinery while on this medication.  Recommend your first dose to be taken before bedtime to monitor for side effects safely)

## 2019-09-18 NOTE — ED Provider Notes (Signed)
[facility]    CSN: [id number] Arrival date & time: 09/18/19  1326      History   Chief Complaint Chief Complaint  Patient presents with  . Head Injury  . Abdominal Pain    HPI Ashley Romero is a 24 y.o. female with history of hypertension, obesity, migraines with aura presenting for headache, neck and back pain, right upper quadrant abdominal pain since fall on 09/15/2019.  Patient seen same day in ER for this: Please see notes.  Patient states she was discharged with ibuprofen: No CT done due to minor head injury with unremarkable exam.  Since ER discharge, patient states that she has had persistent neck, back and RUQ pain.  Denies second worsening thereof, vomiting, change in bowel or bladder habit, hematochezia, melena.  Patient has been taking ibuprofen with moderate relief.  Patient denying visual changes, auditory changes.  Patient has been having persistent headaches, now accompanied with nausea as of today.  Patient has not taken anything for headache today.  Patient states this feels similar to previous migraines.  Denies worst headache of life, thunderclap headache, facial droop, slurred speech, weakness or numbness.   Past Medical History:  Diagnosis Date  . ADHD (attention deficit hyperactivity disorder)   . Anemia   . Anxiety   . Constipation   . Depression   . GERD (gastroesophageal reflux disease)    OTC as needed  . Gestational diabetes   . Hypertension   . Hypertrophy of tonsils and adenoids 08/2013   snores during sleep; had sleep study 03/2013:  AHI 1.9, RDI 2.3  . Irregular menses    Nexplanon implant  . Learning disorder involving mathematics   . Migraines     Patient Active Problem List   Diagnosis Date Noted  . History of severe pre-eclampsia 10/26/2016  . History of gestational diabetes 10/26/2016  . Eczema 06/18/2016  . Morbid obesity with BMI of 50.0-59.9, adult ([place]) 04/21/2016  . Preeclampsia, severe 01/16/2016  . Influenza  09/06/2015  . PCOS (polycystic ovarian syndrome) 06/10/2015  . Gastroenteritis 04/30/2015  . Chronic daily headache 03/22/2015  . Migraine 03/22/2015  . Sleep apnea 03/22/2015  . Bipolar I disorder, most recent episode depressed ([place]) 01/13/2015  . Depressive disorder 01/10/2015  . Acanthosis nigricans 06/25/2014  . Seborrheic dermatitis 06/01/2014  . Iron deficiency anemia 01/30/2014  . Central auditory processing disorder 01/03/2014  . School problem 08/25/2013  . Acne vulgaris 05/02/2013  . Fatigue 05/02/2013  . Headache(784.0) 11/03/2012  . BMI (body mass index), pediatric, > 99% for age 53/01/2013  . GERD (gastroesophageal reflux disease) 11/03/2012  . Allergic rhinitis 11/03/2012  . 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 Constipation   . ADHD (attention deficit hyperactivity disorder) 06/26/2011  . Mood disorder ([place]) 06/26/2011    Past Surgical History:  Procedure Laterality Date  . CESAREAN SECTION N/A 01/18/2016   Procedure: CESAREAN SECTION;  Surgeon: Janyth Pupa, DO;  Location: [place];  Service: Obstetrics;  Laterality: N/A;  . TONSILLECTOMY AND ADENOIDECTOMY Bilateral 09/25/2013   Procedure: BILATERAL TONSILLECTOMY AND ADENOIDECTOMY;  Surgeon: Ascencion Dike, MD;  Location: [place];  Service: ENT;  Laterality: Bilateral;  . WISDOM TOOTH EXTRACTION  2015    OB History    Gravida  1   Para  1   Term  1   Preterm      AB      Living  1     SAB  TAB      Ectopic      Multiple  0   Live Births  1            Home Medications    Prior to Admission medications   Medication Sig Start Date End Date Taking? Authorizing Provider  carbamazepine (TEGRETOL XR) 100 MG 12 hr tablet Take 100 mg by mouth at bedtime. 05/11/19   [provider]  cyclobenzaprine (FLEXERIL) 5 MG tablet Take 1 tablet (5 mg total) by mouth 2 (two) times daily as needed for up to 5 days for muscle spasms. 09/18/19 09/23/19  Hall-Potvin, [place],  PA-C  FLUoxetine (PROZAC) 20 MG capsule Take 20 mg by mouth daily. Not taking currently 05/09/19   [provider]  hydrOXYzine (VISTARIL) 25 MG capsule Take by mouth. 05/03/18   [provider]  lisdexamfetamine (VYVANSE) 30 MG capsule Take by mouth. 08/24/18   [provider]  naproxen (NAPROSYN) 500 MG tablet Take 1 tablet (500 mg total) by mouth 2 (two) times daily. 09/18/19   Hall-Potvin, [place], PA-C  traMADol (ULTRAM) 50 MG tablet Take 1 tablet (50 mg total) by mouth every 6 (six) hours as needed. 07/19/18   Lurene Shadow, PA-C  traZODone (DESYREL) 100 MG tablet Take by mouth. 04/29/18   [provider]    Family History Family History  Problem Relation Age of Onset  . Asthma Mother   . Hypertension Mother   . Anesthesia problems Mother        hard to wake up post-op  . Migraines Mother   . Depression Mother   . Anxiety disorder Mother   . Heart disease Maternal Grandmother   . Migraines Maternal Grandmother   . Bipolar disorder Maternal Aunt   . Schizophrenia Maternal Aunt   . Depression Maternal Aunt   . ADD / ADHD Cousin        Many paternal 1 st cousins have ADD/ADHD    Social History Social History   Tobacco Use  . Smoking status: Former Smoker    Packs/day: 0.25    Types: Cigarettes  . Smokeless tobacco: Never Used  . Tobacco comment: outside smokers at home  Substance Use Topics  . Alcohol use: No    Alcohol/week: 0.0 standard drinks  . Drug use: No     Allergies   Patient has no known allergies.   Review of Systems As per HPI   Physical Exam Triage Vital Signs ED Triage Vitals  Enc Vitals Group     BP      Pulse      Resp      Temp      Temp src      SpO2      Weight      Height      Head Circumference      Peak Flow      Pain Score      Pain Loc      Pain Edu?      Excl. in GC?    No data found.  Updated Vital Signs BP 110/73 (BP Location: Left Arm)   Pulse 61   Temp 98.3 F (36.8 C) (Oral)    Resp 16   LMP 08/30/2019   SpO2 98%   Visual Acuity Right Eye Distance:   Left Eye Distance:   Bilateral Distance:    Right Eye Near:   Left Eye Near:    Bilateral Near:     Physical Exam  Constitutional:      General: She is not in acute distress.    Appearance: She is well-developed. She is obese. She is not ill-appearing.  HENT:     Head: Normocephalic and atraumatic.     Right Ear: Tympanic membrane, ear canal and external ear normal.     Left Ear: Tympanic membrane, ear canal and external ear normal.     Ears:     Comments: Negative hemotympanum bilaterally    Mouth/Throat:     Mouth: Mucous membranes are moist.     Pharynx: Oropharynx is clear.  Eyes:     General: No scleral icterus.    Extraocular Movements: Extraocular movements intact.     Conjunctiva/sclera: Conjunctivae normal.     Pupils: Pupils are equal, round, and reactive to light.  Cardiovascular:     Rate and Rhythm: Normal rate and regular rhythm.  Pulmonary:     Effort: Pulmonary effort is normal. No respiratory distress.     Breath sounds: No wheezing or rales.  Abdominal:     General: Bowel sounds are normal.     Palpations: Abdomen is soft. There is no hepatomegaly or splenomegaly.     Comments: Mild RUQ tenderness no crepitus  Musculoskeletal:        General: No deformity. Normal range of motion.     Cervical back: Normal range of motion. No rigidity or tenderness.     Comments: Mild tenderness of right latissimus dorsi without crepitus, bruising, edema.  No bony tenderness throughout C, T, L spines, shoulders, hips.  Lymphadenopathy:     Cervical: No cervical adenopathy.  Skin:    Capillary Refill: Capillary refill takes less than 2 seconds.     Coloration: Skin is not jaundiced.     Findings: No bruising or rash.  Neurological:     General: No focal deficit present.     Mental Status: She is alert.     Cranial Nerves: Cranial nerves are intact.     Sensory: Sensation is intact.      Motor: Motor function is intact.     Coordination: Coordination is intact.     Gait: Gait is intact.  Psychiatric:        Mood and Affect: Mood normal.        Behavior: Behavior normal.      UC Treatments / Results  Labs (all labs ordered are listed, but only abnormal results are displayed) Labs Reviewed - No data to display  EKG   Radiology No results found.  Procedures Procedures (including critical care time)  Medications Ordered in UC Medications  ondansetron (ZOFRAN-ODT) disintegrating tablet 4 mg (4 mg Oral Given 09/18/19 1434)  ketorolac (TORADOL) 30 MG/ML injection 30 mg (30 mg Intramuscular Given 09/18/19 1434)  dexamethasone (DECADRON) injection 10 mg (10 mg Intramuscular Given 09/18/19 1434)    Initial Impression / Assessment and Plan / UC Course  I have reviewed the triage vital signs and the nursing notes.  Pertinent labs & imaging results that were available during my care of the patient were reviewed by me and considered in my medical decision making (see chart for details).     Patient afebrile, nontoxic in office today.  Overall musculoskeletal pain is stable: We will treat supportively as outlined below.  Headache consistent with patient's previous migraines.  Patient unsure of head trauma, though noted occipital head trauma at time of ER visit.  No neurocognitive deficit on exam.  Treated migraine in office with medications as outlined  above: Patient tolerated well.  Patient electing to discharge immediately after medication administration.  Return precautions discussed, patient verbalized understanding and is agreeable to plan. Final Clinical Impressions(s) / UC Diagnoses   Final diagnoses:  Migraine with aura and without status migrainosus, not intractable  Musculoskeletal pain     Discharge Instructions     Recommend RICE: rest, ice, compression, elevation as needed for pain.   Heat therapy (hot compress, warm wash red, hot showers, etc.) can help  relax muscles and soothe muscle aches. Cold therapy (ice packs) can be used to help swelling both after injury and after prolonged use of areas of chronic pain/aches.  For pain: take naproxen as discussed.  May take tylenol as needed.  May take muscle relaxer as needed for severe pain / spasm.  (This medication may cause you to become tired so it is important you do not drink alcohol or operate heavy machinery while on this medication.  Recommend your first dose to be taken before bedtime to monitor for side effects safely)    ED Prescriptions    Medication Sig Dispense Auth. Provider   naproxen (NAPROSYN) 500 MG tablet Take 1 tablet (500 mg total) by mouth 2 (two) times daily. 30 tablet Hall-Potvin, [place], PA-C   cyclobenzaprine (FLEXERIL) 5 MG tablet Take 1 tablet (5 mg total) by mouth 2 (two) times daily as needed for up to 5 days for muscle spasms. 10 tablet Hall-Potvin, [place], PA-C     I have reviewed the PDMP during this encounter.   Hall-Potvin, [place], [place] 09/18/19 1629

## 2019-09-18 NOTE — ED Notes (Signed)
Patient able to ambulate independently  
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
## 2019-09-22 ENCOUNTER — Encounter (HOSPITAL_COMMUNITY): Payer: Self-pay | Admitting: Emergency Medicine

## 2019-09-22 ENCOUNTER — Ambulatory Visit (HOSPITAL_COMMUNITY)
Admission: EM | Admit: 2019-09-22 | Discharge: 2019-09-22 | Disposition: A | Discharge status: Home / Self Care | Payer: 59

## 2019-09-22 ENCOUNTER — Emergency Department (HOSPITAL_COMMUNITY)
Admission: EM | Admit: 2019-09-22 | Discharge: 2019-09-22 | Disposition: A | Discharge status: Home / Self Care | Payer: 59 | Attending: Emergency Medicine | Admitting: Emergency Medicine

## 2019-09-22 ENCOUNTER — Other Ambulatory Visit: Payer: Self-pay

## 2019-09-22 DIAGNOSIS — R112 Nausea with vomiting, unspecified: Secondary | ICD-10-CM | POA: Insufficient documentation

## 2019-09-22 DIAGNOSIS — Z5321 Procedure and treatment not carried out due to patient leaving prior to being seen by health care provider: Secondary | ICD-10-CM | POA: Insufficient documentation

## 2019-09-22 DIAGNOSIS — R519 Headache, unspecified: Secondary | ICD-10-CM | POA: Insufficient documentation

## 2019-09-22 DIAGNOSIS — K5900 Constipation, unspecified: Secondary | ICD-10-CM | POA: Diagnosis not present

## 2019-09-22 MED ORDER — ONDANSETRON 4 MG PO TBDP
4.0000 mg | ORAL_TABLET | Freq: Once | ORAL | Status: DC
Start: 2019-09-22 — End: 2019-09-23
  Filled 2019-09-22: qty 1

## 2019-09-22 NOTE — ED Notes (Signed)
Pt was called several times and has not answered for her room or vitals.

## 2019-09-22 NOTE — ED Notes (Signed)
Pt did not respond when called for vitals check 
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
## 2020-01-15 ENCOUNTER — Emergency Department: Admission: EM | Admit: 2020-01-15 | Discharge status: Absent without leave | Payer: Self-pay

## 2020-01-15 ENCOUNTER — Other Ambulatory Visit: Payer: Self-pay

## 2020-01-15 ENCOUNTER — Emergency Department (INDEPENDENT_AMBULATORY_CARE_PROVIDER_SITE_OTHER)
Admission: EM | Admit: 2020-01-15 | Discharge: 2020-01-15 | Disposition: A | Discharge status: Home / Self Care | Payer: Self-pay | Source: Home / Self Care

## 2020-01-15 DIAGNOSIS — M545 Low back pain, unspecified: Secondary | ICD-10-CM

## 2020-01-15 DIAGNOSIS — R0789 Other chest pain: Secondary | ICD-10-CM

## 2020-01-15 DIAGNOSIS — R519 Headache, unspecified: Secondary | ICD-10-CM

## 2020-01-15 NOTE — Discharge Instructions (Signed)
  You may take 500mg  acetaminophen every 4-6 hours or in combination with ibuprofen 400-600mg  every 6-8 hours as needed for pain and inflammation.  You may also alternate cool and warm compresses.  Follow up with family medicine in 1 week if needed, sooner if significantly worsening.
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
## 2020-04-10 ENCOUNTER — Emergency Department (INDEPENDENT_AMBULATORY_CARE_PROVIDER_SITE_OTHER): Payer: 59

## 2020-04-10 ENCOUNTER — Encounter: Payer: Self-pay | Admitting: Emergency Medicine

## 2020-04-10 ENCOUNTER — Other Ambulatory Visit: Payer: Self-pay

## 2020-04-10 ENCOUNTER — Emergency Department (INDEPENDENT_AMBULATORY_CARE_PROVIDER_SITE_OTHER)
Admission: EM | Admit: 2020-04-10 | Discharge: 2020-04-10 | Disposition: A | Discharge status: Home / Self Care | Payer: 59 | Source: Home / Self Care | Attending: Family Medicine | Admitting: Family Medicine

## 2020-04-10 DIAGNOSIS — R140 Abdominal distension (gaseous): Secondary | ICD-10-CM

## 2020-04-10 DIAGNOSIS — K5900 Constipation, unspecified: Secondary | ICD-10-CM | POA: Diagnosis not present

## 2020-04-10 DIAGNOSIS — R112 Nausea with vomiting, unspecified: Secondary | ICD-10-CM

## 2020-04-10 DIAGNOSIS — R1032 Left lower quadrant pain: Secondary | ICD-10-CM

## 2020-04-10 LAB — POCT URINALYSIS DIP (MANUAL ENTRY)
Glucose, UA: NEGATIVE mg/dL
Leukocytes, UA: NEGATIVE
Nitrite, UA: NEGATIVE
Spec Grav, UA: 1.02 (ref 1.010–1.025)
Urobilinogen, UA: 0.2 E.U./dL
pH, UA: 7 (ref 5.0–8.0)

## 2020-04-10 LAB — POCT CBC W AUTO DIFF (K'VILLE URGENT CARE)

## 2020-04-10 MED ORDER — ONDANSETRON 4 MG PO TBDP: ORAL_TABLET | ORAL | 0 refills | Status: DC

## 2020-04-10 NOTE — ED Provider Notes (Signed)
[facility]    CSN: [id number] Arrival date & time: 04/10/20  0803      History   Chief Complaint Chief Complaint  Patient presents with  . Constipation    HPI Ashley Romero is a 24 y.o. female.   Patient complains of two day history of constipation and abdominal cramps that has not responded to stool softener. She has small amounts of watery stool but feels that she needs to have a regular bowel movement.  During the past two days she has had occasional nausea/vomiting, headache, and possible fever. She recalls that she had mild URI symptoms four to five days ago including mild sore throat, sinus congestion, and cough now improved. Patient has a long history of constipation since "middle school."  The history is provided by the patient.    Past Medical History:  Diagnosis Date  . ADHD (attention deficit hyperactivity disorder)   . Anemia   . Anxiety   . Constipation   . Depression   . GERD (gastroesophageal reflux disease)    OTC as needed  . Gestational diabetes   . Hypertension   . Hypertrophy of tonsils and adenoids 08/2013   snores during sleep; had sleep study 03/2013:  AHI 1.9, RDI 2.3  . Irregular menses    Nexplanon implant  . Learning disorder involving mathematics   . Migraines     Patient Active Problem List   Diagnosis Date Noted  . History of severe pre-eclampsia 10/26/2016  . History of gestational diabetes 10/26/2016  . Eczema 06/18/2016  . Morbid obesity with BMI of 50.0-59.9, adult (HCC) 04/21/2016  . Preeclampsia, severe 01/16/2016  . Influenza 09/06/2015  . PCOS (polycystic ovarian syndrome) 06/10/2015  . Gastroenteritis 04/30/2015  . Chronic daily headache 03/22/2015  . Migraine 03/22/2015  . Sleep apnea 03/22/2015  . Bipolar I disorder, most recent episode depressed (HCC) 01/13/2015  . Depressive disorder 01/10/2015  . Acanthosis nigricans 06/25/2014  . Seborrheic dermatitis 06/01/2014  . Iron deficiency anemia  01/30/2014  . Central auditory processing disorder 01/03/2014  . School problem 08/25/2013  . Acne vulgaris 05/02/2013  . Fatigue 05/02/2013  . Headache(784.0) 11/03/2012  . BMI (body mass index), pediatric, > 99% for age 28/01/2013  . GERD (gastroesophageal reflux disease) 11/03/2012  . Allergic rhinitis 11/03/2012  . 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 Constipation   . ADHD (attention deficit hyperactivity disorder) 06/26/2011  . Mood disorder (HCC) 06/26/2011    Past Surgical History:  Procedure Laterality Date  . CESAREAN SECTION N/A 01/18/2016   Procedure: CESAREAN SECTION;  Surgeon: Myna HidalgoJennifer Ozan, DO;  Location: [facility];  Service: Obstetrics;  Laterality: N/A;  . TONSILLECTOMY AND ADENOIDECTOMY Bilateral 09/25/2013   Procedure: BILATERAL TONSILLECTOMY AND ADENOIDECTOMY;  Surgeon: Darletta MollSui W Teoh, MD;  Location: [facility];  Service: ENT;  Laterality: Bilateral;  . WISDOM TOOTH EXTRACTION  2015    OB History    Gravida  1   Para  1   Term  1   Preterm      AB      Living  1     SAB      TAB      Ectopic      Multiple  0   Live Births  1            Home Medications    Prior to Admission medications   Medication Sig Start Date End Date Taking? Authorizing Provider  carbamazepine (TEGRETOL XR) 100 MG  12 hr tablet Take 100 mg by mouth at bedtime. 05/11/19   [provider]  FLUoxetine (PROZAC) 20 MG capsule Take 20 mg by mouth daily. Not taking currently 05/09/19   [provider]  hydrOXYzine (VISTARIL) 25 MG capsule Take by mouth. 05/03/18   [provider]  lisdexamfetamine (VYVANSE) 30 MG capsule Take by mouth. 08/24/18   [provider]  naproxen (NAPROSYN) 500 MG tablet Take 1 tablet (500 mg total) by mouth 2 (two) times daily. 09/18/19   Hall-Potvin, [place], PA-C  ondansetron (ZOFRAN ODT) 4 MG disintegrating tablet Take one tab by mouth Q6hr prn nausea 04/10/20   Lattie Haw, MD  traMADol  (ULTRAM) 50 MG tablet Take 1 tablet (50 mg total) by mouth every 6 (six) hours as needed. 07/19/18   Lurene Shadow, PA-C  traZODone (DESYREL) 100 MG tablet Take by mouth. 04/29/18   [provider]    Family History Family History  Problem Relation Age of Onset  . Asthma Mother   . Hypertension Mother   . Anesthesia problems Mother        hard to wake up post-op  . Migraines Mother   . Depression Mother   . Anxiety disorder Mother   . Heart disease Maternal Grandmother   . Migraines Maternal Grandmother   . Bipolar disorder Maternal Aunt   . Schizophrenia Maternal Aunt   . Depression Maternal Aunt   . ADD / ADHD Cousin        Many paternal 1 st cousins have ADD/ADHD    Social History Social History   Tobacco Use  . Smoking status: Former Smoker    Packs/day: 0.25    Types: Cigarettes  . Smokeless tobacco: Never Used  . Tobacco comment: outside smokers at home  Vaping Use  . Vaping Use: Never used  Substance Use Topics  . Alcohol use: No    Alcohol/week: 0.0 standard drinks  . Drug use: Yes    Types: Marijuana    Comment: smokes marijuana daily      Allergies   Patient has no known allergies.   Review of Systems Review of Systems + sore throat + cough No pleuritic pain No wheezing + nasal congestion No post-nasal drainage No sinus pain/pressure No itchy/red eyes No earache No hemoptysis No SOB ? fever, + chills + nausea + vomiting + abdominal pain ? diarrhea No urinary symptoms No skin rash + fatigue No myalgias + headache Used OTC meds (stool softener) without relief   Physical Exam Triage Vital Signs ED Triage Vitals  Enc Vitals Group     BP 04/10/20 0818 119/73     Pulse Rate 04/10/20 0818 65     Resp 04/10/20 0818 18     Temp 04/10/20 0818 98.8 F (37.1 C)     Temp Source 04/10/20 0818 Oral     SpO2 04/10/20 0818 99 %     Weight --      Height --      Head Circumference --      Peak Flow --      Pain Score 04/10/20 0815  7     Pain Loc --      Pain Edu? --      Excl. in [place]? --    No data found.  Updated Vital Signs BP 119/73 (BP Location: Left Arm)   Pulse 65   Temp 98.8 F (37.1 C) (Oral)   Resp 18   LMP 03/31/2020   SpO2  99%   Visual Acuity Right Eye Distance:   Left Eye Distance:   Bilateral Distance:    Right Eye Near:   Left Eye Near:    Bilateral Near:     Physical Exam Vitals and nursing note reviewed.  Constitutional:      General: She is not in acute distress.    Appearance: She is obese.  HENT:     Head: Normocephalic.     Right Ear: Tympanic membrane normal.     Left Ear: Tympanic membrane normal.     Nose: Nose normal.     Mouth/Throat:     Pharynx: Oropharynx is clear.  Eyes:     Conjunctiva/sclera: Conjunctivae normal.     Pupils: Pupils are equal, round, and reactive to light.  Cardiovascular:     Rate and Rhythm: Normal rate and regular rhythm.     Heart sounds: Normal heart sounds.  Pulmonary:     Breath sounds: Normal breath sounds.  Abdominal:     General: Abdomen is protuberant. Bowel sounds are decreased. There is no distension.     Palpations: Abdomen is soft. There is no hepatomegaly, splenomegaly or mass.     Tenderness: There is abdominal tenderness in the left lower quadrant. There is no right CVA tenderness or left CVA tenderness. Negative signs include McBurney's sign.    Musculoskeletal:        General: No tenderness.     Cervical back: Neck supple.     Right lower leg: No edema.     Left lower leg: No edema.  Lymphadenopathy:     Cervical: No cervical adenopathy.  Skin:    General: Skin is warm and dry.     Findings: No rash.  Neurological:     Mental Status: She is alert and oriented to person, place, and time.      UC Treatments / Results  Labs (all labs ordered are listed, but only abnormal results are displayed) Labs Reviewed  POCT URINALYSIS DIP (MANUAL ENTRY) - Abnormal; Notable for the following components:      Result Value     Clarity, UA cloudy (*)    Bilirubin, UA small (*)    Ketones, POC UA small (15) (*)    Blood, UA small (*)    Protein Ur, POC trace (*)    All other components within normal limits  POCT CBC W AUTO DIFF ([facility]):  WBC 7.4; LY 26.3; MO 5.1; GR 68.6; Hgb 11.5; Platelets 245     EKG   Radiology DG Abd 2 Views  Result Date: 04/10/2020 CLINICAL DATA:  Abdominal bloating, constipation. EXAM: ABDOMEN - 2 VIEW COMPARISON:  Nov 01, 2011. FINDINGS: The bowel gas pattern is normal. Mild amount of stool is seen throughout the colon. There is no evidence of free air. No radio-opaque calculi or other significant radiographic abnormality is seen. IMPRESSION: Mild stool burden. No evidence of bowel obstruction or ileus. Electronically Signed   By: Lupita Raider M.D.   On: 04/10/2020 09:34    Procedures Procedures (including critical care time)  Medications Ordered in UC Medications - No data to display  Initial Impression / Assessment and Plan / UC Course  I have reviewed the triage vital signs and the nursing notes.  Pertinent labs & imaging results that were available during my care of the patient were reviewed by me and considered in my medical decision making (see chart for details).    CBC unremarkable except for mild  anemia. Suspect chronic constipation exacerbated by mild recent URI/fever.  Rx for Zofran ODT. Followup with Family Doctor in one week.  Recommend TSH at that time.   Final Clinical Impressions(s) / UC Diagnoses   Final diagnoses:  Constipation, unspecified constipation type  Non-intractable vomiting with nausea, unspecified vomiting type  Abdominal pain, acute, left lower quadrant     Discharge Instructions     Begin clear liquids for about 24 hours, then may begin a BRAT diet (Bananas, Rice, Applesauce, Toast) for about 24 hours when nausea improved. Then gradually advance to a regular diet as tolerated.     Use one glycerin rectal suppository  daily at bedtime for one week.  Use one Fleet's saline enema every morning for about 5 to 7 days. Take daily Miralax, one capful mixed in 4 to 8 ounces of water until bowel movements are regular. As constipation improves, decrease to 1/4 to 1/2 capful in 4 to 8 ounces of water daily for about 2 months. Recommend increasing natural fiber in diet.  May also take a daily fiber product such as Citrucel with plenty of fluid.   Followup with your family doctor in one week for thyroid testing.  If symptoms become significantly worse during the night or over the weekend, proceed to the local emergency room.     ED Prescriptions    Medication Sig Dispense Auth. Provider   ondansetron (ZOFRAN ODT) 4 MG disintegrating tablet Take one tab by mouth Q6hr prn nausea 12 tablet Lattie Haw, MD        Lattie Haw, MD 04/14/20 [id number]

## 2020-04-10 NOTE — ED Triage Notes (Signed)
Patient c/o Constipation x 2 days.  Patient state's she's having ABD "cramps".   Patient has used stool softener at home with no relief.   Patient states "I haven't been able to keep anything down or use the bathroom in 2 days".   Patient has history of constipation since "middle school".   Patient had constant diarrhea before period of constipation began . Patient describes a "stomach bug" that started Sunday.

## 2020-04-10 NOTE — Discharge Instructions (Addendum)
Begin clear liquids for about 24 hours, then may begin a BRAT diet (Bananas, Rice, Applesauce, Toast) for about 24 hours when nausea improved. Then gradually advance to a regular diet as tolerated.     Use one glycerin rectal suppository daily at bedtime for one week.  Use one Fleet's saline enema every morning for about 5 to 7 days. Take daily Miralax, one capful mixed in 4 to 8 ounces of water until bowel movements are regular. As constipation improves, decrease to 1/4 to 1/2 capful in 4 to 8 ounces of water daily for about 2 months. Recommend increasing natural fiber in diet.  May also take a daily fiber product such as Citrucel with plenty of fluid.   Followup with your family doctor in one week for thyroid testing.  If symptoms become significantly worse during the night or over the weekend, proceed to the local emergency room.

## 2020-06-03 ENCOUNTER — Emergency Department (INDEPENDENT_AMBULATORY_CARE_PROVIDER_SITE_OTHER)
Admission: EM | Admit: 2020-06-03 | Discharge: 2020-06-03 | Disposition: A | Discharge status: Home / Self Care | Payer: 59 | Source: Home / Self Care

## 2020-06-03 ENCOUNTER — Other Ambulatory Visit: Payer: Self-pay

## 2020-06-03 DIAGNOSIS — L0100 Impetigo, unspecified: Secondary | ICD-10-CM

## 2020-06-03 DIAGNOSIS — J302 Other seasonal allergic rhinitis: Secondary | ICD-10-CM

## 2020-06-03 MED ORDER — FLUTICASONE PROPIONATE 50 MCG/ACT NA SUSP: 2.0000 | Freq: Every day | NASAL | 2 refills | Status: DC

## 2020-06-03 MED ORDER — MUPIROCIN 2 % EX OINT: TOPICAL_OINTMENT | CUTANEOUS | 0 refills | Status: DC

## 2020-06-03 NOTE — ED Triage Notes (Signed)
Patient presents to Urgent Care with complaints of drainage and pain on her left nostril since trying to pierce it herself 2-3 days ago. Patient reports she has noticed drainage coming from inside her nostril.

## 2020-06-03 NOTE — ED Provider Notes (Signed)
[name]    CSN: [id number] Arrival date & time: 06/03/20  1029      History   Chief Complaint Chief Complaint  Patient presents with  . Piercing Problem    Nose    HPI Ashley Romero is a 24 y.o. female.   HPI Ashley Romero is a 24 y.o. female presenting to UC with c/o yellow drainage from her left nostril after attempting to pierce her nose 3 days ago.  She now has the piercing in her Right nostril but states she tried 2-3 times on the Left side without success. Pt used a self-piercing kit from [employer].  She is concerned about infection. Denies pain at this time. Denies fever or chills. Denies cough, postnasal drainage, or sore throat. Tdap UTD 2017  Past Medical History:  Diagnosis Date  . ADHD (attention deficit hyperactivity disorder)   . Anemia   . Anxiety   . Constipation   . Depression   . GERD (gastroesophageal reflux disease)    OTC as needed  . Gestational diabetes   . Hypertension   . Hypertrophy of tonsils and adenoids 08/2013   snores during sleep; had sleep study 03/2013:  AHI 1.9, RDI 2.3  . Irregular menses    Nexplanon implant  . Learning disorder involving mathematics   . Migraines     Patient Active Problem List   Diagnosis Date Noted  . History of severe pre-eclampsia 10/26/2016  . History of gestational diabetes 10/26/2016  . Eczema 06/18/2016  . Morbid obesity with BMI of 50.0-59.9, adult (HCC) 04/21/2016  . Preeclampsia, severe 01/16/2016  . Influenza 09/06/2015  . PCOS (polycystic ovarian syndrome) 06/10/2015  . Gastroenteritis 04/30/2015  . Chronic daily headache 03/22/2015  . Migraine 03/22/2015  . Sleep apnea 03/22/2015  . Bipolar I disorder, most recent episode depressed (HCC) 01/13/2015  . Depressive disorder 01/10/2015  . Acanthosis nigricans 06/25/2014  . Seborrheic dermatitis 06/01/2014  . Iron deficiency anemia 01/30/2014  . Central auditory processing disorder 01/03/2014  . School problem 08/25/2013    . Acne vulgaris 05/02/2013  . Fatigue 05/02/2013  . Headache(784.0) 11/03/2012  . BMI (body mass index), pediatric, > 99% for age 44/01/2013  . GERD (gastroesophageal reflux disease) 11/03/2012  . Allergic rhinitis 11/03/2012  . 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 Constipation   . ADHD (attention deficit hyperactivity disorder) 06/26/2011  . Mood disorder (HCC) 06/26/2011    Past Surgical History:  Procedure Laterality Date  . CESAREAN SECTION N/A 01/18/2016   Procedure: CESAREAN SECTION;  Surgeon: Myna Hidalgo, DO;  Location: [facility];  Service: Obstetrics;  Laterality: N/A;  . TONSILLECTOMY AND ADENOIDECTOMY Bilateral 09/25/2013   Procedure: BILATERAL TONSILLECTOMY AND ADENOIDECTOMY;  Surgeon: Darletta Moll, MD;  Location: [facility];  Service: ENT;  Laterality: Bilateral;  . WISDOM TOOTH EXTRACTION  2015    OB History    Gravida  1   Para  1   Term  1   Preterm      AB      Living  1     SAB      TAB      Ectopic      Multiple  0   Live Births  1            Home Medications    Prior to Admission medications   Medication Sig Start Date End Date Taking? Authorizing Provider  carbamazepine (TEGRETOL XR) 100 MG 12 hr tablet Take 100  mg by mouth at bedtime. 05/11/19   [provider]  FLUoxetine (PROZAC) 20 MG capsule Take 20 mg by mouth daily. Not taking currently 05/09/19   [provider]  fluticasone (FLONASE) 50 MCG/ACT nasal spray Place 2 sprays into both nostrils daily. 06/03/20   Noe Gens, PA-C  hydrOXYzine (VISTARIL) 25 MG capsule Take by mouth. 05/03/18   [provider]  lisdexamfetamine (VYVANSE) 30 MG capsule Take by mouth. 08/24/18   [provider]  mupirocin ointment (BACTROBAN) 2 % Apply to wound 3 times daily for 5 days 06/03/20   Noe Gens, PA-C  naproxen (NAPROSYN) 500 MG tablet Take 1 tablet (500 mg total) by mouth 2 (two) times daily. 09/18/19   Hall-Potvin, [place], PA-C   ondansetron (ZOFRAN ODT) 4 MG disintegrating tablet Take one tab by mouth Q6hr prn nausea 04/10/20   Kandra Nicolas, MD  traMADol (ULTRAM) 50 MG tablet Take 1 tablet (50 mg total) by mouth every 6 (six) hours as needed. 07/19/18   Noe Gens, PA-C  traZODone (DESYREL) 100 MG tablet Take by mouth. 04/29/18   [provider]    Family History Family History  Problem Relation Age of Onset  . Asthma Mother   . Hypertension Mother   . Anesthesia problems Mother        hard to wake up post-op  . Migraines Mother   . Depression Mother   . Anxiety disorder Mother   . Heart disease Maternal Grandmother   . Migraines Maternal Grandmother   . Bipolar disorder Maternal Aunt   . Schizophrenia Maternal Aunt   . Depression Maternal Aunt   . ADD / ADHD Cousin        Many paternal 1 st cousins have ADD/ADHD    Social History Social History   Tobacco Use  . Smoking status: Former Smoker    Packs/day: 0.25    Types: Cigarettes  . Smokeless tobacco: Never Used  . Tobacco comment: outside smokers at home  Vaping Use  . Vaping Use: Never used  Substance Use Topics  . Alcohol use: No    Alcohol/week: 0.0 standard drinks  . Drug use: Yes    Types: Marijuana    Comment: smokes marijuana daily      Allergies   Patient has no known allergies.   Review of Systems Review of Systems  Constitutional: Negative for chills and fever.  HENT: Positive for rhinorrhea. Negative for facial swelling and sore throat.   Neurological: Negative for headaches.     Physical Exam Triage Vital Signs ED Triage Vitals  Enc Vitals Group     BP 06/03/20 1054 111/75     Pulse Rate 06/03/20 1054 80     Resp 06/03/20 1054 14     Temp 06/03/20 1054 99 F (37.2 C)     Temp Source 06/03/20 1054 Oral     SpO2 06/03/20 1054 98 %     Weight --      Height --      Head Circumference --      Peak Flow --      Pain Score 06/03/20 1052 0     Pain Loc --      Pain Edu? --      Excl. in [place]? --     No data found.  Updated Vital Signs BP 111/75 (BP Location: Left Arm)   Pulse 80   Temp 99 F (37.2 C) (Oral)   Resp 14  SpO2 98%   Visual Acuity Right Eye Distance:   Left Eye Distance:   Bilateral Distance:    Right Eye Near:   Left Eye Near:    Bilateral Near:     Physical Exam Vitals and nursing note reviewed.  Constitutional:      Appearance: Normal appearance. She is well-developed.  HENT:     Head: Normocephalic and atraumatic.     Nose: Mucosal edema ( bilateral) present. No congestion or rhinorrhea.     Right Nostril: No epistaxis, septal hematoma or occlusion.     Left Nostril: No foreign body, epistaxis, septal hematoma or occlusion.     Right Turbinates: Swollen. Not pale.     Left Turbinates: Swollen. Not pale.   Cardiovascular:     Rate and Rhythm: Normal rate.  Pulmonary:     Effort: Pulmonary effort is normal.  Musculoskeletal:        General: Normal range of motion.     Cervical back: Normal range of motion.  Skin:    General: Skin is warm and dry.  Neurological:     Mental Status: She is alert and oriented to person, place, and time.  Psychiatric:        Behavior: Behavior normal.      UC Treatments / Results  Labs (all labs ordered are listed, but only abnormal results are displayed) Labs Reviewed - No data to display  EKG   Radiology No results found.  Procedures Procedures (including critical care time)  Medications Ordered in UC Medications - No data to display  Initial Impression / Assessment and Plan / UC Course  I have reviewed the triage vital signs and the nursing notes.  Pertinent labs & imaging results that were available during my care of the patient were reviewed by me and considered in my medical decision making (see chart for details).     Bilateral nasal mucosa edema. No discharge or bleeding noted. Minimal tenderness to Right nostril around piercing.  No tenderness to Left side. Will tx edema with Flonase   Mupirocin ointment prescribed. F/u later this week if not improving.  Final Clinical Impressions(s) / UC Diagnoses   Final diagnoses:  Impetigo  Seasonal allergic rhinitis, unspecified trigger     Discharge Instructions      You should use the nasal spray to help with inflammation in your nostrils and the antibiotic ointment on a cotton-swab to apply in each nostril to help with infection from recent piercing. Follow up in 4-5 days if not improving, sooner if worsening.    ED Prescriptions    Medication Sig Dispense Auth. Provider   fluticasone (FLONASE) 50 MCG/ACT nasal spray Place 2 sprays into both nostrils daily. 16 g Leeroy Cha O, PA-C   mupirocin ointment (BACTROBAN) 2 % Apply to wound 3 times daily for 5 days 22 g Noe Gens, [place]     PDMP not reviewed this encounter.   Noe Gens, [place] 06/03/20 1116

## 2020-06-03 NOTE — Discharge Instructions (Signed)
  You should use the nasal spray to help with inflammation in your nostrils and the antibiotic ointment on a cotton-swab to apply in each nostril to help with infection from recent piercing. Follow up in 4-5 days if not improving, sooner if worsening.

## 2020-10-04 ENCOUNTER — Other Ambulatory Visit: Payer: Self-pay

## 2020-10-04 ENCOUNTER — Emergency Department (HOSPITAL_BASED_OUTPATIENT_CLINIC_OR_DEPARTMENT_OTHER)
Admission: EM | Admit: 2020-10-04 | Discharge: 2020-10-04 | Disposition: A | Discharge status: Home / Self Care | Payer: 59 | Attending: Emergency Medicine | Admitting: Emergency Medicine

## 2020-10-04 ENCOUNTER — Encounter (HOSPITAL_BASED_OUTPATIENT_CLINIC_OR_DEPARTMENT_OTHER): Payer: Self-pay

## 2020-10-04 DIAGNOSIS — R0981 Nasal congestion: Secondary | ICD-10-CM | POA: Insufficient documentation

## 2020-10-04 DIAGNOSIS — F17210 Nicotine dependence, cigarettes, uncomplicated: Secondary | ICD-10-CM | POA: Insufficient documentation

## 2020-10-04 DIAGNOSIS — R0602 Shortness of breath: Secondary | ICD-10-CM | POA: Diagnosis present

## 2020-10-04 DIAGNOSIS — Z20822 Contact with and (suspected) exposure to covid-19: Secondary | ICD-10-CM | POA: Insufficient documentation

## 2020-10-04 DIAGNOSIS — I10 Essential (primary) hypertension: Secondary | ICD-10-CM | POA: Insufficient documentation

## 2020-10-04 DIAGNOSIS — R059 Cough, unspecified: Secondary | ICD-10-CM | POA: Insufficient documentation

## 2020-10-04 DIAGNOSIS — K5900 Constipation, unspecified: Secondary | ICD-10-CM | POA: Insufficient documentation

## 2020-10-04 LAB — SARS CORONAVIRUS 2 (TAT 6-24 HRS): SARS Coronavirus 2: NEGATIVE

## 2020-10-04 NOTE — ED Provider Notes (Signed)
[facility] EMERGENCY DEPT Provider Note   CSN: [id number] Arrival date & time: 10/04/20  0818     History Chief Complaint  Patient presents with  . Shortness of Breath    Ashley Romero is a 25 y.o. female.  25 yo F with a chief complaints of shortness of breath.  Going on for the past couple days.  Associated with a little bit of cough and congestion.  She is also been constipated.  This is not uncommon problem for her.  She states that she does get constipated from time to time usually improves with stool softeners.  She denies abdominal pain denies nausea vomiting fever.  Shortness of breath is worse with minimal exertion.  Denies any chest pain or pressure.  Denies history of PE or DVT denies mopped assist denies unilateral lower extremity edema denies recent surgery immobilization or hospitalization.  Denies estrogen use denies history of cancer.  The history is provided by the patient.  Shortness of Breath Severity:  Mild Onset quality:  Gradual Duration:  2 days Timing:  Constant Progression:  Worsening Chronicity:  New Relieved by:  Nothing Worsened by:  Nothing Ineffective treatments:  None tried Associated symptoms: cough   Associated symptoms: no chest pain, no fever, no headaches, no vomiting and no wheezing        Past Medical History:  Diagnosis Date  . ADHD (attention deficit hyperactivity disorder)   . Anemia   . Anxiety   . Constipation   . Depression   . GERD (gastroesophageal reflux disease)    OTC as needed  . Gestational diabetes   . Hypertension   . Hypertrophy of tonsils and adenoids 08/2013   snores during sleep; had sleep study 03/2013:  AHI 1.9, RDI 2.3  . Irregular menses    Nexplanon implant  . Learning disorder involving mathematics   . Migraines     Patient Active Problem List   Diagnosis Date Noted  . History of severe pre-eclampsia 10/26/2016  . History of gestational diabetes 10/26/2016  . Eczema 06/18/2016  .  Morbid obesity with BMI of 50.0-59.9, adult (HCC) 04/21/2016  . Preeclampsia, severe 01/16/2016  . Influenza 09/06/2015  . PCOS (polycystic ovarian syndrome) 06/10/2015  . Gastroenteritis 04/30/2015  . Chronic daily headache 03/22/2015  . Migraine 03/22/2015  . Sleep apnea 03/22/2015  . Bipolar I disorder, most recent episode depressed (HCC) 01/13/2015  . Depressive disorder 01/10/2015  . Acanthosis nigricans 06/25/2014  . Seborrheic dermatitis 06/01/2014  . Iron deficiency anemia 01/30/2014  . Central auditory processing disorder 01/03/2014  . School problem 08/25/2013  . Acne vulgaris 05/02/2013  . Fatigue 05/02/2013  . Headache(784.0) 11/03/2012  . BMI (body mass index), pediatric, > 99% for age 37/01/2013  . GERD (gastroesophageal reflux disease) 11/03/2012  . Allergic rhinitis 11/03/2012  . 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 Constipation   . ADHD (attention deficit hyperactivity disorder) 06/26/2011  . Mood disorder (HCC) 06/26/2011    Past Surgical History:  Procedure Laterality Date  . CESAREAN SECTION N/A 01/18/2016   Procedure: CESAREAN SECTION;  Surgeon: Myna Hidalgo, DO;  [facility]: [facility];  Service: Obstetrics;  Laterality: N/A;  . TONSILLECTOMY AND ADENOIDECTOMY Bilateral 09/25/2013   Procedure: BILATERAL TONSILLECTOMY AND ADENOIDECTOMY;  Surgeon: Darletta Moll, MD;  Location: [facility];  Service: ENT;  Laterality: Bilateral;  . WISDOM TOOTH EXTRACTION  2015     OB History    Gravida  1   Para  1  Term  1   Preterm      AB      Living  1     SAB      IAB      Ectopic      Multiple  0   Live Births  1           Family History  Problem Relation Age of Onset  . Asthma Mother   . Hypertension Mother   . Anesthesia problems Mother        hard to wake up post-op  . Migraines Mother   . Depression Mother   . Anxiety disorder Mother   . Heart disease Maternal Grandmother   . Migraines Maternal Grandmother   .  Bipolar disorder Maternal Aunt   . Schizophrenia Maternal Aunt   . Depression Maternal Aunt   . ADD / ADHD Cousin        Many paternal 1 st cousins have ADD/ADHD    Social History   Tobacco Use  . Smoking status: Current Every Day Smoker    Packs/day: 0.25    Types: Cigarettes  . Smokeless tobacco: Never Used  . Tobacco comment: outside smokers at home  Vaping Use  . Vaping Use: Never used  Substance Use Topics  . Alcohol use: No    Alcohol/week: 0.0 standard drinks  . Drug use: Yes    Types: Marijuana    Comment: smokes marijuana daily     Home Medications Prior to Admission medications   Medication Sig Start Date End Date Taking? Authorizing Provider  carbamazepine (TEGRETOL XR) 100 MG 12 hr tablet Take 100 mg by mouth at bedtime. 05/11/19   [provider]  FLUoxetine (PROZAC) 20 MG capsule Take 20 mg by mouth daily. Not taking currently 05/09/19   [provider]  fluticasone (FLONASE) 50 MCG/ACT nasal spray Place 2 sprays into both nostrils daily. 06/03/20   Lurene Shadow, PA-C  hydrOXYzine (VISTARIL) 25 MG capsule Take by mouth. 05/03/18   [provider]  lisdexamfetamine (VYVANSE) 30 MG capsule Take by mouth. 08/24/18   [provider]  mupirocin ointment (BACTROBAN) 2 % Apply to wound 3 times daily for 5 days 06/03/20   Lurene Shadow, PA-C  naproxen (NAPROSYN) 500 MG tablet Take 1 tablet (500 mg total) by mouth 2 (two) times daily. 09/18/19   Hall-Potvin, [place], PA-C  ondansetron (ZOFRAN ODT) 4 MG disintegrating tablet Take one tab by mouth Q6hr prn nausea 04/10/20   Lattie Haw, MD  traMADol (ULTRAM) 50 MG tablet Take 1 tablet (50 mg total) by mouth every 6 (six) hours as needed. 07/19/18   Lurene Shadow, PA-C  traZODone (DESYREL) 100 MG tablet Take by mouth. 04/29/18   [provider]    Allergies    Patient has no known allergies.  Review of Systems   Review of Systems  Constitutional: Negative for chills and  fever.  HENT: Positive for congestion. Negative for rhinorrhea.   Eyes: Negative for redness and visual disturbance.  Respiratory: Positive for cough and shortness of breath. Negative for wheezing.   Cardiovascular: Negative for chest pain and palpitations.  Gastrointestinal: Negative for nausea and vomiting.  Genitourinary: Negative for dysuria and urgency.  Musculoskeletal: Negative for arthralgias and myalgias.  Skin: Negative for pallor and wound.  Neurological: Negative for dizziness and headaches.    Physical Exam Updated Vital Signs BP (!) 124/51 (BP Location: Right Arm)   Pulse 79   Temp 98.8 F (  37.1 C) (Oral)   Resp 16   Ht 5' (1.524 m)   Wt 108.9 kg   LMP 09/15/2020   SpO2 100%   BMI 46.87 kg/m   Physical Exam Vitals and nursing note reviewed.  Constitutional:      General: She is not in acute distress.    Appearance: She is well-developed. She is not diaphoretic.     Comments: BMI 47  HENT:     Head: Normocephalic and atraumatic.  Eyes:     Pupils: Pupils are equal, round, and reactive to light.  Cardiovascular:     Rate and Rhythm: Normal rate and regular rhythm.     Heart sounds: No murmur heard. No friction rub. No gallop.   Pulmonary:     Effort: Pulmonary effort is normal.     Breath sounds: No wheezing or rales.  Chest:     Chest wall: No tenderness.  Abdominal:     General: There is no distension.     Palpations: Abdomen is soft.     Tenderness: There is no abdominal tenderness.  Musculoskeletal:        General: No tenderness.     Cervical back: Normal range of motion and neck supple.  Skin:    General: Skin is warm and dry.  Neurological:     Mental Status: She is alert and oriented to person, place, and time.  Psychiatric:        Behavior: Behavior normal.     ED Results / Procedures / Treatments   Labs (all labs ordered are listed, but only abnormal results are displayed) Labs Reviewed  SARS CORONAVIRUS 2 (TAT 6-24 HRS)     EKG None ED ECG REPORT   Date: 10/04/2020  Rate: 76  Rhythm: normal sinus rhythm  QRS Axis: normal  Intervals: normal  ST/T Wave abnormalities: normal  Conduction Disutrbances:none  Narrative Interpretation:   Old EKG Reviewed: unchanged  I have personally reviewed the EKG tracing and agree with the computerized printout as noted.  Radiology No results found.  Procedures Procedures   Medications Ordered in ED Medications - No data to display  ED Course  I have reviewed the triage vital signs and the nursing notes.  Pertinent labs & imaging results that were available during my care of the patient were reviewed by me and considered in my medical decision making (see chart for details).    MDM Rules/Calculators/A&P                          25 yo F with a chief complaints of shortness of breath on exertion.  Going on for a couple days associated with cough and congestion.  Likely viral syndrome.  She is well-appearing nontoxic 100% on room air.  PERC negative.  With clear lung sounds do not feel like a chest x-ray would be helpful.  No pallor or other signs of anemia.  Will hold off on blood work.  PCP follow-up.  ekg without concerning finding.  8:49 AM:  I have discussed the diagnosis/risks/treatment options with the patient and believe the pt to be eligible for discharge home to follow-up with PCP. We also discussed returning to the ED immediately if new or worsening sx occur. We discussed the sx which are most concerning (e.g., sudden worsening pain, fever, inability to tolerate by mouth) that necessitate immediate return. Medications administered to the patient during their visit and any new prescriptions provided to the patient are  listed below.  Medications given during this visit Medications - No data to display   The patient appears reasonably screen and/or stabilized for discharge and I doubt any other medical condition or other [facility] requiring further  screening, evaluation, or treatment in the ED at this time prior to discharge.    Final Clinical Impression(s) / ED Diagnoses Final diagnoses:  Shortness of breath    Rx / DC Orders ED Discharge Orders    None       Melene PlanFloyd, Felicita Nuncio, DO 10/04/20 [phone]

## 2020-10-04 NOTE — ED Triage Notes (Signed)
Pt reports  That she is having SOB since yesterday and with some constipation.

## 2020-10-04 NOTE — Discharge Instructions (Signed)
Most likely have a viral syndrome.  Please return for worsening difficulty breathing chest pain or if you feel you are going to pass out.  Please follow-up with the family doctor.  He can take MiraLAX as it is instructed on the bottle or if you prefer you can take 8 scoops of miralax in 32oz of whatever you would like to drink.(Gatorade comes in this size) You can also use a fleets enema which you can buy over the counter at the pharmacy.  Return for worsening abdominal pain, vomiting or fever.

## 2020-10-07 ENCOUNTER — Emergency Department
Admission: RE | Admit: 2020-10-07 | Discharge: 2020-10-07 | Discharge status: Against Medical Advice | Payer: 59 | Source: Ambulatory Visit | Attending: Internal Medicine | Admitting: Internal Medicine

## 2020-10-07 ENCOUNTER — Other Ambulatory Visit: Payer: Self-pay

## 2020-10-07 NOTE — ED Triage Notes (Addendum)
Patient presents to Urgent Care with complaints of lower abdominal pain since a week ago. Patient reports the pain is intermittent, denies urinary involvement, LMP 09/15/20, last BM today. Starts usually 5-10 minutes after she eats. Pt requesting x-ray.  Went to the ED for same last week, states she has been having stomach pain problems for years and "nobody tries to help".

## 2020-10-08 ENCOUNTER — Ambulatory Visit: Payer: Self-pay

## 2020-10-11 ENCOUNTER — Emergency Department (HOSPITAL_BASED_OUTPATIENT_CLINIC_OR_DEPARTMENT_OTHER)
Admission: EM | Admit: 2020-10-11 | Discharge: 2020-10-11 | Disposition: A | Discharge status: Home / Self Care | Payer: 59 | Attending: Emergency Medicine | Admitting: Emergency Medicine

## 2020-10-11 ENCOUNTER — Emergency Department (HOSPITAL_BASED_OUTPATIENT_CLINIC_OR_DEPARTMENT_OTHER): Payer: 59 | Admitting: Radiology

## 2020-10-11 ENCOUNTER — Other Ambulatory Visit: Payer: Self-pay

## 2020-10-11 ENCOUNTER — Encounter (HOSPITAL_BASED_OUTPATIENT_CLINIC_OR_DEPARTMENT_OTHER): Payer: Self-pay

## 2020-10-11 ENCOUNTER — Emergency Department (HOSPITAL_BASED_OUTPATIENT_CLINIC_OR_DEPARTMENT_OTHER): Payer: 59

## 2020-10-11 DIAGNOSIS — Z87891 Personal history of nicotine dependence: Secondary | ICD-10-CM | POA: Insufficient documentation

## 2020-10-11 DIAGNOSIS — Z8719 Personal history of other diseases of the digestive system: Secondary | ICD-10-CM | POA: Diagnosis not present

## 2020-10-11 DIAGNOSIS — R110 Nausea: Secondary | ICD-10-CM | POA: Insufficient documentation

## 2020-10-11 DIAGNOSIS — R0602 Shortness of breath: Secondary | ICD-10-CM | POA: Insufficient documentation

## 2020-10-11 DIAGNOSIS — R109 Unspecified abdominal pain: Secondary | ICD-10-CM | POA: Diagnosis present

## 2020-10-11 DIAGNOSIS — R1013 Epigastric pain: Secondary | ICD-10-CM | POA: Insufficient documentation

## 2020-10-11 DIAGNOSIS — K219 Gastro-esophageal reflux disease without esophagitis: Secondary | ICD-10-CM | POA: Insufficient documentation

## 2020-10-11 DIAGNOSIS — R079 Chest pain, unspecified: Secondary | ICD-10-CM | POA: Insufficient documentation

## 2020-10-11 DIAGNOSIS — I10 Essential (primary) hypertension: Secondary | ICD-10-CM | POA: Diagnosis not present

## 2020-10-11 LAB — CBC WITH DIFFERENTIAL/PLATELET
Abs Immature Granulocytes: 0.02 10*3/uL (ref 0.00–0.07)
Basophils Absolute: 0 10*3/uL (ref 0.0–0.1)
Basophils Relative: 0 %
Eosinophils Absolute: 0.1 10*3/uL (ref 0.0–0.5)
Eosinophils Relative: 1 %
HCT: 34.3 % — ABNORMAL LOW (ref 36.0–46.0)
Hemoglobin: 11.6 g/dL — ABNORMAL LOW (ref 12.0–15.0)
Immature Granulocytes: 0 %
Lymphocytes Relative: 27 %
Lymphs Abs: 2.7 10*3/uL (ref 0.7–4.0)
MCH: 28.8 pg (ref 26.0–34.0)
MCHC: 33.8 g/dL (ref 30.0–36.0)
MCV: 85.1 fL (ref 80.0–100.0)
Monocytes Absolute: 0.5 10*3/uL (ref 0.1–1.0)
Monocytes Relative: 5 %
Neutro Abs: 6.6 10*3/uL (ref 1.7–7.7)
Neutrophils Relative %: 67 %
Platelets: 230 10*3/uL (ref 150–400)
RBC: 4.03 MIL/uL (ref 3.87–5.11)
RDW: 13.9 % (ref 11.5–15.5)
WBC: 9.9 10*3/uL (ref 4.0–10.5)
nRBC: 0 % (ref 0.0–0.2)

## 2020-10-11 LAB — COMPREHENSIVE METABOLIC PANEL
ALT: 9 U/L (ref 0–44)
AST: 12 U/L — ABNORMAL LOW (ref 15–41)
Albumin: 4.1 g/dL (ref 3.5–5.0)
Alkaline Phosphatase: 53 U/L (ref 38–126)
Anion gap: 9 (ref 5–15)
BUN: 9 mg/dL (ref 6–20)
CO2: 24 mmol/L (ref 22–32)
Calcium: 9.4 mg/dL (ref 8.9–10.3)
Chloride: 105 mmol/L (ref 98–111)
Creatinine, Ser: 0.78 mg/dL (ref 0.44–1.00)
GFR, Estimated: >60 mL/min (ref >60)
Glucose, Bld: 90 mg/dL (ref 70–99)
Potassium: 3.5 mmol/L (ref 3.5–5.1)
Sodium: 138 mmol/L (ref 135–145)
Total Bilirubin: 0.4 mg/dL (ref 0.3–1.2)
Total Protein: 7.4 g/dL (ref 6.5–8.1)

## 2020-10-11 LAB — URINALYSIS, ROUTINE W REFLEX MICROSCOPIC
Bilirubin Urine: NEGATIVE
Glucose, UA: NEGATIVE mg/dL
Ketones, ur: NEGATIVE mg/dL
Leukocytes,Ua: NEGATIVE
Nitrite: NEGATIVE
Specific Gravity, Urine: 1.024 (ref 1.005–1.030)
pH: 7 (ref 5.0–8.0)

## 2020-10-11 LAB — LIPASE, BLOOD: Lipase: 19 U/L (ref 11–51)

## 2020-10-11 LAB — PREGNANCY, URINE: Preg Test, Ur: NEGATIVE

## 2020-10-11 MED ORDER — ONDANSETRON HCL 4 MG/2ML IJ SOLN
4.0000 mg | Freq: Once | INTRAMUSCULAR | Status: AC
  Administered 2020-10-11: 4 mg via INTRAVENOUS
  Filled 2020-10-11: qty 2

## 2020-10-11 MED ORDER — PANTOPRAZOLE SODIUM 20 MG PO TBEC: 20.0000 mg | DELAYED_RELEASE_TABLET | Freq: Every day | ORAL | 0 refills | Status: DC

## 2020-10-11 MED ORDER — KETOROLAC TROMETHAMINE 15 MG/ML IJ SOLN
15.0000 mg | Freq: Once | INTRAMUSCULAR | Status: AC
  Administered 2020-10-11: 15 mg via INTRAVENOUS
  Filled 2020-10-11: qty 1

## 2020-10-11 MED ORDER — IBUPROFEN 800 MG PO TABS
800.0000 mg | ORAL_TABLET | Freq: Three times a day (TID) | ORAL | 0 refills | Status: DC | PRN
Start: 2020-10-11 — End: 2021-09-22

## 2020-10-11 NOTE — ED Provider Notes (Signed)
[facility] EMERGENCY DEPT Provider Note   CSN: [id number] Arrival date & time: 10/11/20  2147     History Chief Complaint  Patient presents with  . Abdominal Pain  . Shortness of Breath    Ashley Romero is a 25 y.o. female.  HPI   Patient presented to the ED for evaluation of abdominal pain and chest pain.  Patient states she has been having the symptoms for a couple of weeks.  Whenever she eats or drinks she starts to have stomach pain.  She has been having nausea but has not had any vomiting.  She denies any diarrhea hollows had some constipation.  Patient states she was seen in the ED about a week ago for the symptoms.  Records indicate that she was being evaluated for shortness of breath although is no mention of abdominal pain at that time.  Patient did follow-up with her primary care doctor on the 12th.  They have her scheduled for an ultrasound as an outpatient.  Patient states she still having her symptoms.  Getting worse so she could not wait. Past Medical History:  Diagnosis Date  . ADHD (attention deficit hyperactivity disorder)   . Anemia   . Anxiety   . Constipation   . Depression   . GERD (gastroesophageal reflux disease)    OTC as needed  . Gestational diabetes   . Hypertension   . Hypertrophy of tonsils and adenoids 08/2013   snores during sleep; had sleep study 03/2013:  AHI 1.9, RDI 2.3  . Irregular menses    Nexplanon implant  . Learning disorder involving mathematics   . Migraines     Patient Active Problem List   Diagnosis Date Noted  . History of severe pre-eclampsia 10/26/2016  . History of gestational diabetes 10/26/2016  . Eczema 06/18/2016  . Morbid obesity with BMI of 50.0-59.9, adult (HCC) 04/21/2016  . Preeclampsia, severe 01/16/2016  . Influenza 09/06/2015  . PCOS (polycystic ovarian syndrome) 06/10/2015  . Gastroenteritis 04/30/2015  . Chronic daily headache 03/22/2015  . Migraine 03/22/2015  . Sleep apnea 03/22/2015   . Bipolar I disorder, most recent episode depressed (HCC) 01/13/2015  . Depressive disorder 01/10/2015  . Acanthosis nigricans 06/25/2014  . Seborrheic dermatitis 06/01/2014  . Iron deficiency anemia 01/30/2014  . Central auditory processing disorder 01/03/2014  . School problem 08/25/2013  . Acne vulgaris 05/02/2013  . Fatigue 05/02/2013  . Headache(784.0) 11/03/2012  . BMI (body mass index), pediatric, > 99% for age 07/06/2012  . GERD (gastroesophageal reflux disease) 11/03/2012  . Allergic rhinitis 11/03/2012  . 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 Constipation   . ADHD (attention deficit hyperactivity disorder) 06/26/2011  . Mood disorder (HCC) 06/26/2011    Past Surgical History:  Procedure Laterality Date  . CESAREAN SECTION N/A 01/18/2016   Procedure: CESAREAN SECTION;  Surgeon: Myna Hidalgo, DO;  Location: [facility];  Service: Obstetrics;  Laterality: N/A;  . TONSILLECTOMY AND ADENOIDECTOMY Bilateral 09/25/2013   Procedure: BILATERAL TONSILLECTOMY AND ADENOIDECTOMY;  Surgeon: Darletta Moll, MD;  Location: [facility];  Service: ENT;  Laterality: Bilateral;  . WISDOM TOOTH EXTRACTION  2015     OB History    Gravida  1   Para  1   Term  1   Preterm      AB      Living  1     SAB      IAB      Ectopic  Multiple  0   Live Births  1           Family History  Problem Relation Age of Onset  . Asthma Mother   . Hypertension Mother   . Anesthesia problems Mother        hard to wake up post-op  . Migraines Mother   . Depression Mother   . Anxiety disorder Mother   . Heart disease Maternal Grandmother   . Migraines Maternal Grandmother   . Bipolar disorder Maternal Aunt   . Schizophrenia Maternal Aunt   . Depression Maternal Aunt   . ADD / ADHD Cousin        Many paternal 1 st cousins have ADD/ADHD    Social History   Tobacco Use  . Smoking status: Former Smoker    Packs/day: 0.25    Types: Cigarettes    Quit date:  10/07/2017    Years since quitting: 3.0  . Smokeless tobacco: Never Used  . Tobacco comment: outside smokers at home  Vaping Use  . Vaping Use: Never used  Substance Use Topics  . Alcohol use: No    Alcohol/week: 0.0 standard drinks  . Drug use: Yes    Types: Marijuana    Comment: smokes marijuana daily     Home Medications Prior to Admission medications   Medication Sig Start Date End Date Taking? Authorizing Provider  carbamazepine (TEGRETOL XR) 100 MG 12 hr tablet Take 100 mg by mouth at bedtime. 05/11/19   [provider]  FLUoxetine (PROZAC) 20 MG capsule Take 20 mg by mouth daily. Not taking currently 05/09/19   [provider]  fluticasone (FLONASE) 50 MCG/ACT nasal spray Place 2 sprays into both nostrils daily. 06/03/20   Lurene Shadow, PA-C  hydrOXYzine (VISTARIL) 25 MG capsule Take by mouth. 05/03/18   [provider]  lisdexamfetamine (VYVANSE) 30 MG capsule Take by mouth. 08/24/18   [provider]  mupirocin ointment (BACTROBAN) 2 % Apply to wound 3 times daily for 5 days 06/03/20   Lurene Shadow, PA-C  naproxen (NAPROSYN) 500 MG tablet Take 1 tablet (500 mg total) by mouth 2 (two) times daily. 09/18/19   Hall-Potvin, [place], PA-C  ondansetron (ZOFRAN ODT) 4 MG disintegrating tablet Take one tab by mouth Q6hr prn nausea 04/10/20   Lattie Haw, MD  traMADol (ULTRAM) 50 MG tablet Take 1 tablet (50 mg total) by mouth every 6 (six) hours as needed. 07/19/18   Lurene Shadow, PA-C  traZODone (DESYREL) 100 MG tablet Take by mouth. 04/29/18   [provider]    Allergies    Patient has no known allergies.  Review of Systems   Review of Systems  All other systems reviewed and are negative.   Physical Exam Updated Vital Signs BP 129/79 (BP Location: Left Arm)   Pulse (!) 59   Temp 98.4 F (36.9 C) (Oral)   Resp 18   Ht 1.549 m (5\' 1" )   Wt 108.9 kg   LMP 09/15/2020   SpO2 100%   BMI 45.35 kg/m   Physical Exam Vitals  and nursing note reviewed.  Constitutional:      General: She is not in acute distress.    Appearance: She is well-developed.  HENT:     Head: Normocephalic and atraumatic.     Right Ear: External ear normal.     Left Ear: External ear normal.  Eyes:     General: No scleral icterus.  Right eye: No discharge.        Left eye: No discharge.     Conjunctiva/sclera: Conjunctivae normal.  Neck:     Trachea: No tracheal deviation.  Cardiovascular:     Rate and Rhythm: Normal rate and regular rhythm.  Pulmonary:     Effort: Pulmonary effort is normal. No respiratory distress.     Breath sounds: Normal breath sounds. No stridor. No wheezing or rales.  Abdominal:     General: Bowel sounds are normal. There is no distension.     Palpations: Abdomen is soft.     Tenderness: There is abdominal tenderness in the epigastric area. There is no guarding or rebound.  Musculoskeletal:        General: No tenderness.     Cervical back: Neck supple.  Skin:    General: Skin is warm and dry.     Findings: No rash.  Neurological:     Mental Status: She is alert.     Cranial Nerves: No cranial nerve deficit (no facial droop, extraocular movements intact, no slurred speech).     Sensory: No sensory deficit.     Motor: No abnormal muscle tone or seizure activity.     Coordination: Coordination normal.     ED Results / Procedures / Treatments   Labs (all labs ordered are listed, but only abnormal results are displayed) Labs Reviewed  COMPREHENSIVE METABOLIC PANEL  LIPASE, BLOOD  CBC WITH DIFFERENTIAL/PLATELET  URINALYSIS, ROUTINE W REFLEX MICROSCOPIC  PREGNANCY, URINE    EKG None  Radiology No results found.  Procedures Procedures   Medications Ordered in ED Medications  ketorolac (TORADOL) 15 MG/ML injection 15 mg (has no administration in time range)  ondansetron (ZOFRAN) injection 4 mg (has no administration in time range)    ED Course  I have reviewed the triage vital  signs and the nursing notes.  Pertinent labs & imaging results that were available during my care of the patient were reviewed by me and considered in my medical decision making (see chart for details).    MDM Rules/Calculators/A&P                          Pt presented with persistent upper abd pain.  Scheduled for outpt [place] but sx worsened.  ?biliary colic, PUD.  [place] normal.   CXR and ua pending at shift change.   Linwood Dibbles, MD 10/13/20 1736

## 2020-10-11 NOTE — ED Provider Notes (Addendum)
Care of the patient assumed at the change of shift. Patient here for upper abdominal pain, mild SOB. Labs normal, [place] neg. Pending CXR and urine tests.  Physical Exam  BP 135/68 (BP Location: Left Arm)   Pulse (!) 52   Temp 98.4 F (36.9 C) (Oral)   Resp 18   Ht 5\' 1"  (1.549 m)   Wt 108.9 kg   LMP 09/15/2020   SpO2 100%   BMI 45.35 kg/m   Physical Exam  ED Course/Procedures     Procedures  MDM  CXR is clear, UA is neg for infection. Patient with likely GERD, plan discharge with Rx for Protonix.. Follow up with PCP for recheck. RTED for any worsening.       09/17/2020, MD 10/11/20 10/13/20    [id number], MD 10/11/20 [phone]

## 2020-10-11 NOTE — ED Triage Notes (Addendum)
Pt has multiple complaints  -  States she been  Having SOB /CP  - also states whenever she takes food her stomach hurts and nausea   States she feels dehydrated -  She takes water more than her normal  -    Stated it been going on x 2 wks

## 2021-03-27 IMAGING — DX DG ANKLE COMPLETE 3+V*L*
3 series · 3 of 3 positions shown · non-contrast
Comparison: July 19, 2018.

CLINICAL DATA: Left ankle pain after injury yesterday.

EXAM:
LEFT ANKLE COMPLETE - 3+ VIEW

[ankle ap]
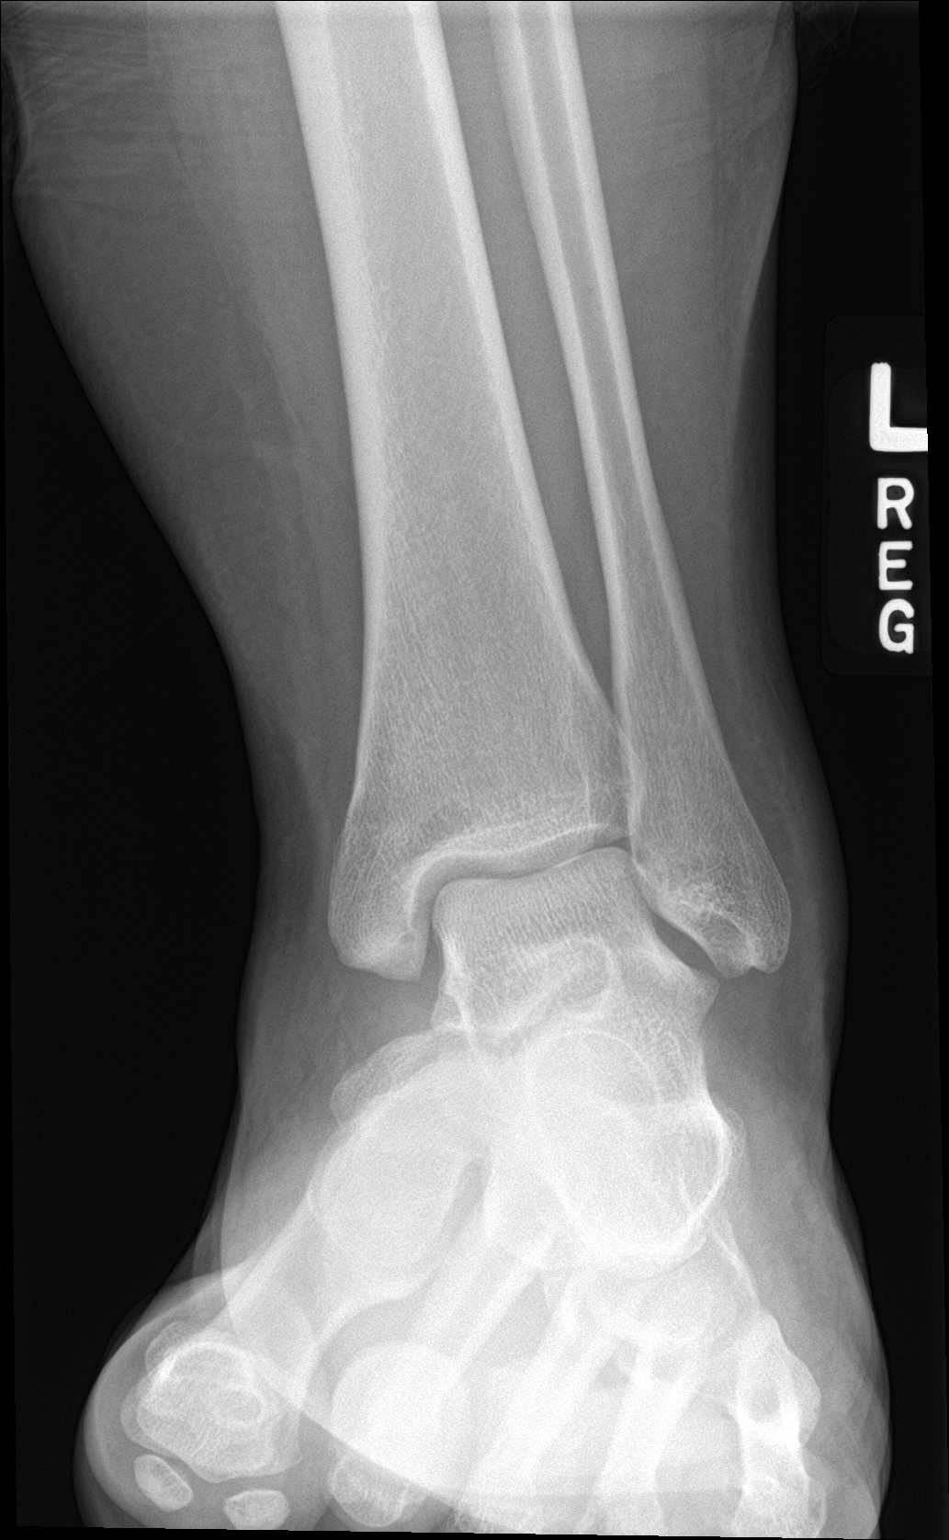

[ankle obl]
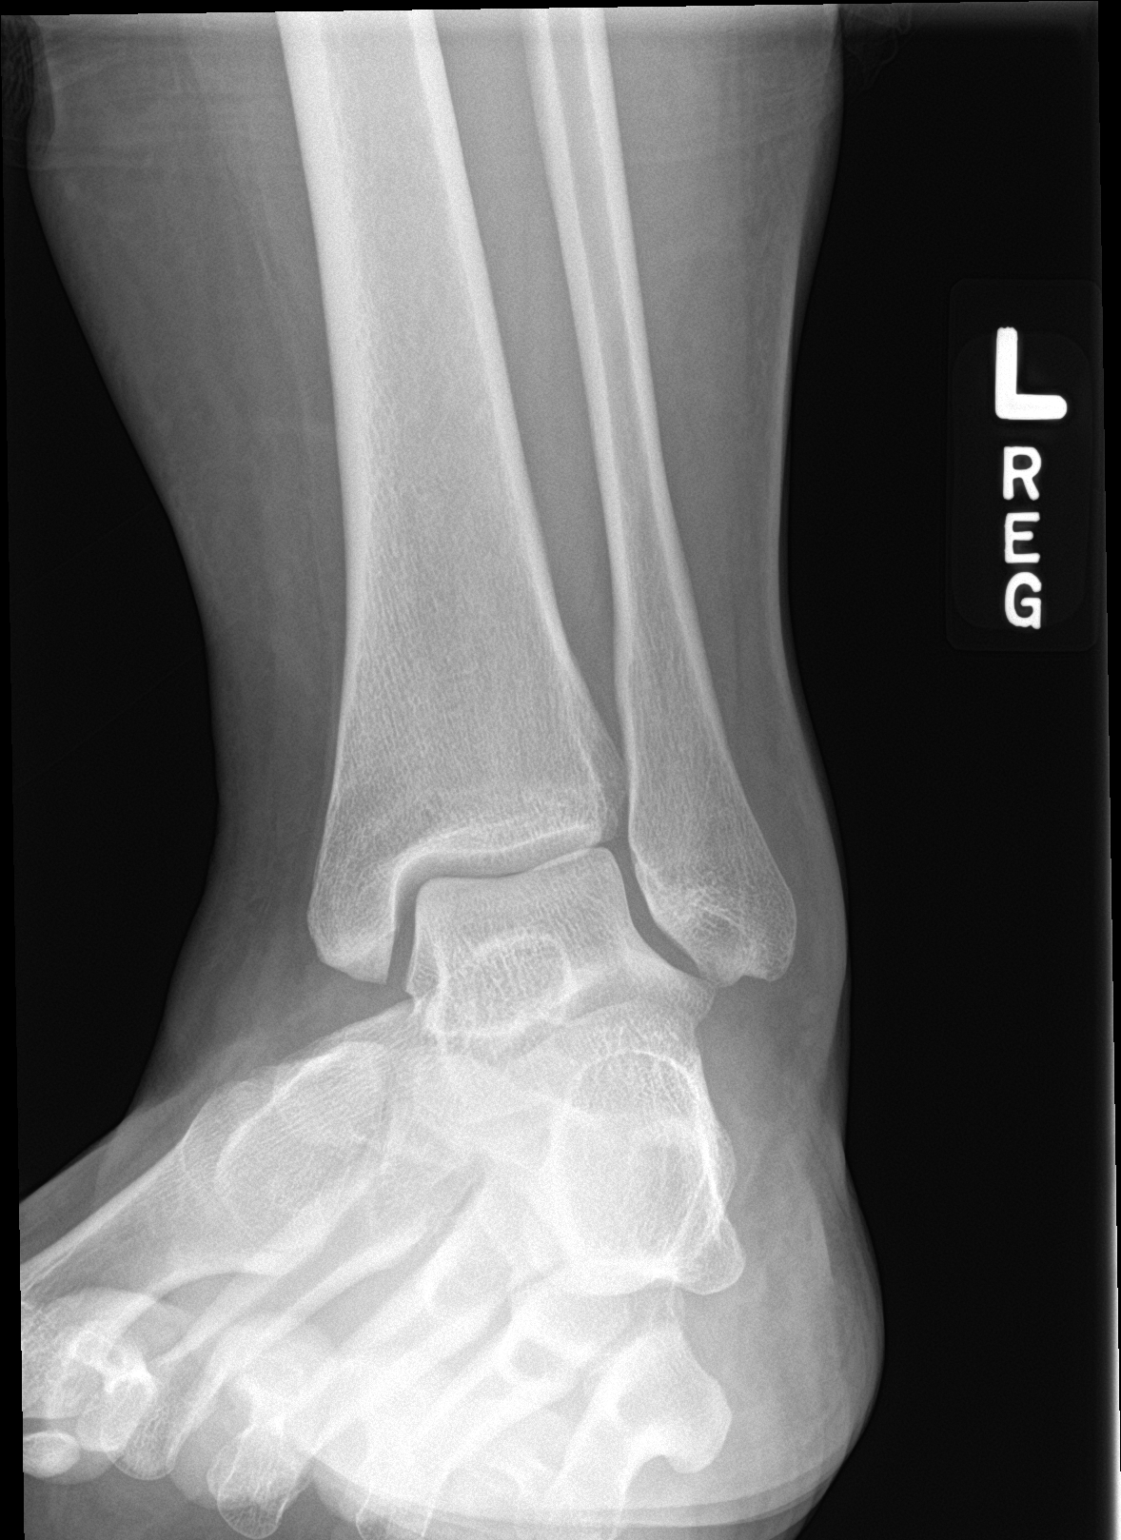

[ankle lat]
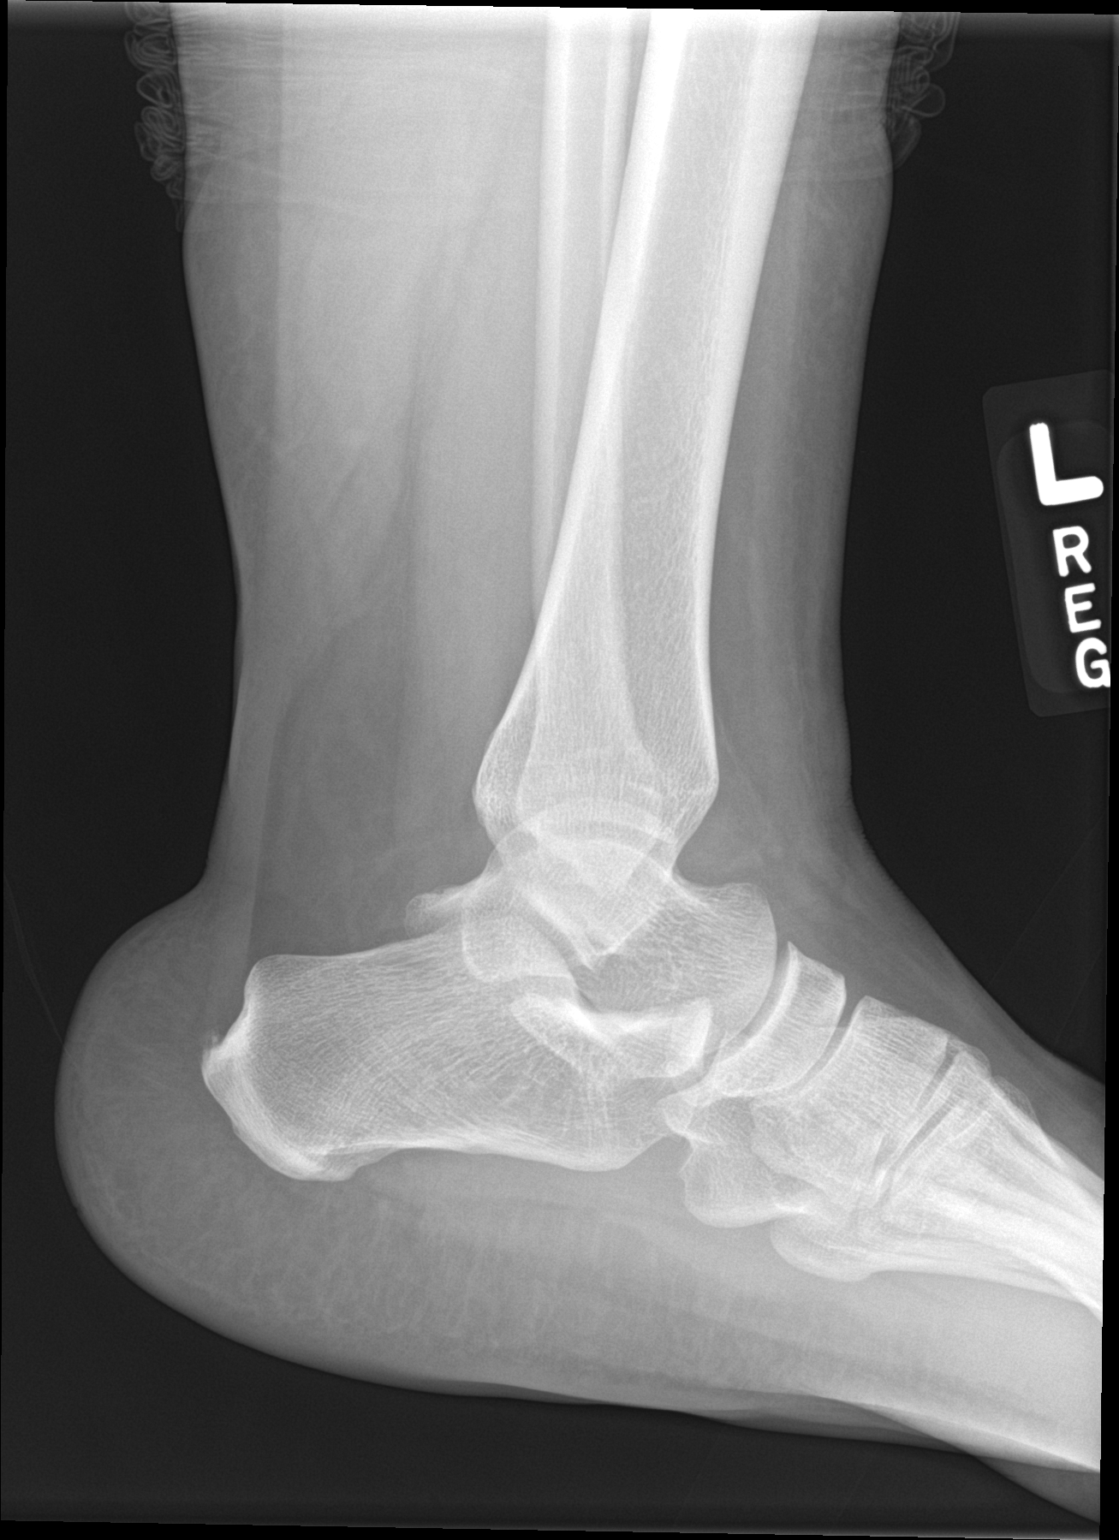

[3 of 3 positions shown; findings below may reference images not displayed]

FINDINGS: There is no evidence of fracture, dislocation, or joint effusion.
There is no evidence of arthropathy or other focal bone abnormality.
Soft tissues are unremarkable.
IMPRESSION: Negative.

## 2021-07-30 ENCOUNTER — Other Ambulatory Visit: Payer: Self-pay

## 2021-07-30 ENCOUNTER — Ambulatory Visit: Payer: 59 | Attending: Family Medicine | Admitting: Audiologist

## 2021-07-30 DIAGNOSIS — H9325 Central auditory processing disorder: Secondary | ICD-10-CM | POA: Insufficient documentation

## 2021-07-30 NOTE — Procedures (Signed)
[facility] [address] [place], [place]  [id number] [phone]  Report of Auditory Processing Evaluation     Patient: Ashley Deeratori R Zia  Date of Birth: 08/07/1995  Date of Evaluation: 07/30/2021     Referent: Delbert HarnessKim Briscoe, MD  Audiologist: Ammie FerrierSarah Pebbles Zeiders, AuD   Ashley DeerNatori R Delker, 26 y.o. years old, was seen for a central auditory evaluation upon referral of Dr. Earnest BaileyBriscoe in order to clarify auditory skills and provide recommendations as needed.   HISTORY        Ashley Deeratori R Youngman was originally diagnosed with auditory processing disorder by audiologist Lewie Loroneborah Woodward in 2015. Ashley Romero had poor scores in several areas of her processing. In depth accomodation recommendations were made at the time. Ashley Romero returned today due to continued concerns of hearing in noise and understanding speech. Ashley Romero says she is struggling to understand what people are saying, more so from her right ear. She has occasional hight pitched tinnitus in both ears. She has occasional pain lasting less than a minute in her right ear. Ashley Romero has a history of excessive use of airpods. Pain was correlated with extended headphone use. She now tries to only wear her airpods an hour or less a day. Ashley Romero was wearing her airpod in the right ear only for the exam today. She has no other diagnoses or medical concerns. She has two ear infections when younger. There is no family history of hearing loss. Ashley Romero has no history of hearing loss. No other relevant case history reported.   EVALUATION   Central auditory (re)evaluation consists of standard puretone and speech audiometry and tests that overwork the auditory system to assess auditory integrity. Patients recognize signals altered or distorted through electronic filtering, are presented in competition with a speech or noise signal, or are presented in a series. Scores > 2 SDs below the mean for age are abnormal. Specific central auditory  processing disorder is defined as two poor scores on tests taxing similar skills. Results provide information regarding integrity of central auditory processes including binaural processing, auditory discrimination, and temporal processing. Tests and results are given below.  Test-Taking Behaviors:   Ashley Romero  participated in all tasks throughout session and results reliably estimate auditory skills at this time.  Peripheral auditory testing results :   Otoscopic inspection reveals clear ear canals with visible tympanic membranes.  Puretone audiometric testing revealed normal hearing in both ears from 250-8,000 Hz. Speech Reception Thresholds were 10 dB in the left ear and 5 dB in the right ear. Word recognition was 100 % for the right ear and 100 % for the left ear. NU-6 words were presented 40 dB SL re: STs. Immittance testing yielded type A normally shaped tympanograms for each ear.   central auditory processing test explanations and results  Test Explanation and Performance:  A test score > 2 SDs below the mean for age is indicated as 'below' and is considered statistically significant. A normal test score is indicated as 'above'.   Quick Speech in Noise [facility](SIN) Test: Ashley DunningNatori repeated un-altered sentences with a female talker. The QuickSIN test provides a quick estimate of SNR loss. A list of six sentences with five key words per sentence is presented in four-talker babble noise. The sentences are presented at pre- recorded signal-to-noise ratios which decrease in 5-dB steps from 25 (very easy) to 0 (extremely difficult). The SNRs used are: 25, 20, 15, 10, 5 and 0, encompassing normal to severely impaired performance in noise. Taxes binaural separation and  discrimination skills. Kelleen averaged a 6.0 dB SNR showing mild SNR loss.Ashley Dunning scored 6 dB SNR. This means speech needs to be at least 6dB louder than the noise for her to understand. The cutoff for normal is 3dB SNR.   Low Pass Filtered  Speech (LPFS) Test: Ryanna repeated the words filtered to remove or reduce high frequency cues. Taxes auditory closure and discrimination.  Lilinoe performed above for the right ear and above  for the left ear.  Trenika scored 100% on the right ear and 88% on the left ear. The age matched norm is 78% on the right ear and 78% on the left ear.   Time-Compressed Speech (TCR) Test: Tasneem repeated words altered through reduction of duration (45% time-compression) plus addition of 0.3 seconds reverberation. Taxes auditory closure and discrimination. Jahniah performed above for the right ear and above  for the left ear.  Princessa scored 76% on the right ear and 76% on the left ear. The age matched norm is 73% on the right ear and 73% on the left ear.   Competing Sentences Test (CST): Abbygail repeated one of two sentences presented simultaneously, one to each ear, e.g. report right ear only, report left ear only. Taxes binaural separation skills. Venise performed above for the right ear and below  for the left ear.   Donata scored 100% on the right ear and 80% on the left ear. The age matched norm is 90% on the right ear and 90% on the left ear.   Dichotic Digits (DD) Test: Ashley Dunning repeated four digits (1-10, excluding 7) presented simultaneously, two to each ear. Less linguistically loaded than other dichotic measures, taxes binaural integration. Dwight performed above for the right ear and above  for the left ear.  Loann scored 92% on the right ear and 90% on the left ear. The age matched norm is 90% on the right ear and 90% on the left ear.   Staggered [employer] (SSW) Test: Oreatha repeats two compound words, presented one to each ear and aligned such that second syllable of first spondee overlaps in time with first syllable of second spondee, e.g., RE - upstairs, LE - downtown, overlapping syllables - stairs and down. Taxes binaural integration and organization skills. Janaysia performed above for the right ear  and below  for the left ear.   RNC and LNC stands for right and left non competing stimulus (only one word in one ear) while RC and LC stands for right and left competing (one word in both ears at the same time).  Laurenashley had RNC 0 errors, RC 2 errors, LC 6 errors and LNC 1 error. Allowed errors for age matched peer is RNC 1 error, RC 2 errors, LC 4 errors and LNC 1 error.  Pitch Patterns Sequence (PPS) Test: (Musiek scoring): Ashley Dunning labeled and/or imitated three-tone sequences composed of high (H) and low (L) tones, e.g., LHL, HHL, LLH, etc. Taxes pitch discrimination, pattern recognition, binaural integration, sequencing and organization. Xochilt performed below for both ears.  Sanita scored 60% for both ears. The age matched norm is 80% for both ears.  Trisha was able to accurately mimic tonal patterns, correctly humming 100% of presented tonal patterns.  Testing Results:   Adequate hearing sensitivity and middle ear function for each ear.    Adequate performance on degraded speech tasks (LPFS, TCR) taxing auditory discrimination and closure   Difficulty with some excessive left ear suppression across dichotic listening tasks taxing binaural integration (DD, SSW)  and separation (CST, speech in noise).   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 In short this deficit makes it hard "bring everything together".  This can result in excessive left ear suppression, where the left ear performs significantly and consistently worse than the right on tests of auditory processing. This deficit creates difficulty associating the appropriate meaning to a word and following patterns. It may negatively impact the sound to letter association needed for writing and reading. Someone with an  integration deficit tends to need extra time to complete tasks, have difficulty tolerating distraction, and fatigue quickly. Over all Meily's scores did improve since her last evaluation.    Recommendations   Dorethea was advised of the results. 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 Based on today's test results, the following recommendations are made.  Follow up testing is not necessary as today's testing was compared to adult normative data. After age twelve auditory processing has reach adult development.   Tran Randle exhibits difficulty with auditory processing. Mekala was counseled on the following as ways to help minimize the impact of her processing deficit as an adult: Sit or stand near and facing the speaker. Use visual cues to enhance comprehension.  Take listening breaks during the day to minimize auditory fatigue. If noise is needed to help concentrate use steady state noise, like brown white or pink noise instead of music.  Wait for all instructions/information before beginning or asking questions.  Guess" when possible. Learn to take educated guesses when not sure of the answer.  Ask for clarification as needed. Ask for extra time as needed to respond.  Intervention can be performed at home, the follow activities are recommended to help strengthen the specific auditory processing deficits: Music lessons.  Current research strongly indicates that learning to play a musical instrument results in improved neurological function related to auditory processing that benefits integration and hearing in background noise. Therefore, is recommended that Safiatou learn to play a musical instrument for 10-15 minutes at least four days per week for 1-2 years. Please be aware that being able to play the instrument well does not seem to matter, the benefit comes with the learning. Please refer to the following website for further info: wwwcrv.com, Davonna Belling, PhD.  LACEs speech comprehension software trains the brain to better manage background noise levels. LACEs Speech-in-Noise exercises strengthens auditory processing, helping the brain better filter out background noise so people can focus on the voice or voices theyre listening to. https://laceauditorytraining.com/   Please contact the audiologist, Ammie Ferrier with any questions about this report or the evaluation. Thank you for the opportunity to work with you.  Sincerely    Ammie Ferrier, AuD, CCC-A

## 2021-08-06 ENCOUNTER — Ambulatory Visit
Admission: EM | Admit: 2021-08-06 | Discharge: 2021-08-06 | Disposition: A | Discharge status: Home / Self Care | Payer: 59 | Attending: Emergency Medicine | Admitting: Emergency Medicine

## 2021-08-06 ENCOUNTER — Other Ambulatory Visit: Payer: Self-pay

## 2021-08-06 DIAGNOSIS — L089 Local infection of the skin and subcutaneous tissue, unspecified: Secondary | ICD-10-CM

## 2021-08-06 DIAGNOSIS — S01531A Puncture wound without foreign body of lip, initial encounter: Secondary | ICD-10-CM | POA: Diagnosis not present

## 2021-08-06 MED ORDER — SULFAMETHOXAZOLE-TRIMETHOPRIM 800-160 MG PO TABS: 2.0000 | ORAL_TABLET | Freq: Two times a day (BID) | ORAL | 0 refills | Status: AC

## 2021-08-06 NOTE — ED Provider Notes (Signed)
[facility]    CSN: [id number] Arrival date & time: 08/06/21  1516    HISTORY   Chief Complaint  Patient presents with   piercing   HPI Ashley Romero is a 26 y.o. female. Patient reports getting her lip pierced about 2 weeks ago, states the piercing was corrected so she removed it.  Its that recently she noticed that there was pus coming out of the wound, has also noticed that her lip is more swollen and tender to palpation.  Patient denies fever, redness.  The history is provided by the patient.  Past Medical History:  Diagnosis Date   ADHD (attention deficit hyperactivity disorder)    Anemia    Anxiety    Constipation    Depression    GERD (gastroesophageal reflux disease)    OTC as needed   Gestational diabetes    Hypertension    Hypertrophy of tonsils and adenoids 08/2013   snores during sleep; had sleep study 03/2013:  AHI 1.9, RDI 2.3   Irregular menses    Nexplanon implant   Learning disorder involving mathematics    Migraines    Patient Active Problem List   Diagnosis Date Noted   History of severe pre-eclampsia 10/26/2016   History of gestational diabetes 10/26/2016   Eczema 06/18/2016   Morbid obesity with BMI of 50.0-59.9, adult (HCC) 04/21/2016   Preeclampsia, severe 01/16/2016   Influenza 09/06/2015   PCOS (polycystic ovarian syndrome) 06/10/2015   Gastroenteritis 04/30/2015   Chronic daily headache 03/22/2015   Migraine 03/22/2015   Sleep apnea 03/22/2015   Bipolar I disorder, most recent episode depressed (HCC) 01/13/2015   Depressive disorder 01/10/2015   Acanthosis nigricans 06/25/2014   Seborrheic dermatitis 06/01/2014   Iron deficiency anemia 01/30/2014   Central auditory processing disorder 01/03/2014   School problem 08/25/2013   Acne vulgaris 05/02/2013   Fatigue 05/02/2013   Headache(784.0) 11/03/2012   BMI (body mass index), pediatric, > 99% for age 73/01/2013   GERD (gastroesophageal reflux disease) 11/03/2012    Allergic rhinitis 11/03/2012   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   Constipation    ADHD (attention deficit hyperactivity disorder) 06/26/2011   Mood disorder (HCC) 06/26/2011   Past Surgical History:  Procedure Laterality Date   CESAREAN SECTION N/A 01/18/2016   Procedure: CESAREAN SECTION;  Surgeon: Myna HidalgoJennifer Ozan, DO;  Location: [facility];  Service: Obstetrics;  Laterality: N/A;   TONSILLECTOMY AND ADENOIDECTOMY Bilateral 09/25/2013   Procedure: BILATERAL TONSILLECTOMY AND ADENOIDECTOMY;  Surgeon: Darletta MollSui W Teoh, MD;  Location: [facility];  Service: ENT;  Laterality: Bilateral;   WISDOM TOOTH EXTRACTION  2015   OB History     Gravida  1   Para  1   Term  1   Preterm      AB      Living  1      SAB      IAB      Ectopic      Multiple  0   Live Births  1          Home Medications    Prior to Admission medications   Medication Sig Start Date End Date Taking? Authorizing Provider  sulfamethoxazole-trimethoprim (BACTRIM DS) 800-160 MG tablet Take 2 tablets by mouth 2 (two) times daily for 7 days. 08/06/21 08/13/21 Yes Theadora RamaMorgan, Dejanae Helser Scales, PA-C  carbamazepine (TEGRETOL XR) 100 MG 12 hr tablet Take 100 mg by mouth at bedtime. 05/11/19   [provider]  FLUoxetine (  PROZAC) 20 MG capsule Take 20 mg by mouth daily. Not taking currently 05/09/19   [provider]  fluticasone (FLONASE) 50 MCG/ACT nasal spray Place 2 sprays into both nostrils daily. 06/03/20   Lurene Shadow, PA-C  hydrOXYzine (VISTARIL) 25 MG capsule Take by mouth. 05/03/18   [provider]  ibuprofen (ADVIL) 800 MG tablet Take 1 tablet (800 mg total) by mouth every 8 (eight) hours as needed. 10/11/20   Pollyann Savoy, MD  lisdexamfetamine (VYVANSE) 30 MG capsule Take by mouth. 08/24/18   [provider]  mupirocin ointment (BACTROBAN) 2 % Apply to wound 3 times daily for 5 days 06/03/20   Lurene Shadow, PA-C  naproxen (NAPROSYN) 500 MG tablet Take 1  tablet (500 mg total) by mouth 2 (two) times daily. 09/18/19   Hall-Potvin, [place], PA-C  ondansetron (ZOFRAN ODT) 4 MG disintegrating tablet Take one tab by mouth Q6hr prn nausea 04/10/20   Lattie Haw, MD  pantoprazole (PROTONIX) 20 MG tablet Take 1 tablet (20 mg total) by mouth daily. 10/11/20   Pollyann Savoy, MD  traMADol (ULTRAM) 50 MG tablet Take 1 tablet (50 mg total) by mouth every 6 (six) hours as needed. 07/19/18   Lurene Shadow, PA-C  traZODone (DESYREL) 100 MG tablet Take by mouth. 04/29/18   [provider]    Family History Family History  Problem Relation Age of Onset   Asthma Mother    Hypertension Mother    Anesthesia problems Mother        hard to wake up post-op   Migraines Mother    Depression Mother    Anxiety disorder Mother    Heart disease Maternal Grandmother    Migraines Maternal Grandmother    Bipolar disorder Maternal Aunt    Schizophrenia Maternal Aunt    Depression Maternal Aunt    ADD / ADHD Cousin        Many paternal 1 st cousins have ADD/ADHD   Social History Social History   Tobacco Use   Smoking status: Former    Packs/day: 0.25    Types: Cigarettes    Quit date: 10/07/2017    Years since quitting: 3.8   Smokeless tobacco: Never   Tobacco comments:    outside smokers at home  Vaping Use   Vaping Use: Never used  Substance Use Topics   Alcohol use: No    Alcohol/week: 0.0 standard drinks   Drug use: Yes    Types: Marijuana    Comment: smokes marijuana daily    Allergies   Patient has no known allergies.  Review of Systems Review of Systems Pertinent findings noted in history of present illness.   Physical Exam Triage Vital Signs ED Triage Vitals  Enc Vitals Group     BP 04/25/21 0827 (!) 147/82     Pulse Rate 04/25/21 0827 72     Resp 04/25/21 0827 18     Temp 04/25/21 0827 98.3 F (36.8 C)     Temp Source 04/25/21 0827 Oral     SpO2 04/25/21 0827 98 %     Weight --      Height --      Head  Circumference --      Peak Flow --      Pain Score 04/25/21 0826 5     Pain Loc --      Pain Edu? --      Excl. in GC? --   No data found.  Updated Vital Signs BP 112/78 (BP Location: Right Arm)    Pulse 79    Temp 99 F (37.2 C) (Oral)    Resp 18    LMP 08/05/2021    SpO2 97%   Physical Exam Vitals and nursing note reviewed.  Constitutional:      General: She is not in acute distress.    Appearance: Normal appearance. She is not ill-appearing.  HENT:     Head: Normocephalic and atraumatic.     Mouth/Throat:     Lips: Pink. Lesions (Bottom lip is swollen at the center with piercing lesion, no pus appreciated, area is tender to palpation, no erythema or warmth appreciated) present.  Eyes:     General: Lids are normal.        Right eye: No discharge.        Left eye: No discharge.     Extraocular Movements: Extraocular movements intact.     Conjunctiva/sclera: Conjunctivae normal.     Right eye: Right conjunctiva is not injected.     Left eye: Left conjunctiva is not injected.  Neck:     Trachea: Trachea and phonation normal.  Cardiovascular:     Rate and Rhythm: Normal rate and regular rhythm.     Pulses: Normal pulses.     Heart sounds: Normal heart sounds. No murmur heard.   No friction rub. No gallop.  Pulmonary:     Effort: Pulmonary effort is normal. No accessory muscle usage, prolonged expiration or respiratory distress.     Breath sounds: Normal breath sounds. No stridor, decreased air movement or transmitted upper airway sounds. No decreased breath sounds, wheezing, rhonchi or rales.  Chest:     Chest wall: No tenderness.  Musculoskeletal:        General: Normal range of motion.     Cervical back: Normal range of motion and neck supple. Normal range of motion.  Lymphadenopathy:     Cervical: No cervical adenopathy.  Skin:    General: Skin is warm and dry.     Findings: No erythema or rash.  Neurological:     General: No focal deficit present.     Mental  Status: She is alert and oriented to person, place, and time.  Psychiatric:        Mood and Affect: Mood normal.        Behavior: Behavior normal.    Visual Acuity Right Eye Distance:   Left Eye Distance:   Bilateral Distance:    Right Eye Near:   Left Eye Near:    Bilateral Near:     UC Couse / Diagnostics / Procedures:    EKG  Radiology No results found.  Procedures Procedures (including critical care time)  UC Diagnoses / Final Clinical Impressions(s)   I have reviewed the triage vital signs and the nursing notes.  Pertinent labs & imaging results that were available during my care of the patient were reviewed by me and considered in my medical decision making (see chart for details).    Final diagnoses:  Pierced lip infection   Patient advised not to attempt to replace the jewelry, patient states she will not because the piercing was crooked anyway.  Patient given prescription for Bactrim to take twice daily for 7 days.  Follow-up precautions provided.  ED Prescriptions     Medication Sig Dispense Auth. Provider   sulfamethoxazole-trimethoprim (BACTRIM DS) 800-160 MG tablet Take 2 tablets by mouth 2 (two) times daily for 7 days. 28 tablet Lequita Halt,  Lillia Abed Scales, PA-C      PDMP not reviewed this encounter.  Pending results:  Labs Reviewed - No data to display  Medications Ordered in UC: Medications - No data to display  Disposition Upon Discharge:  Condition: stable for discharge home Home: take medications as prescribed; routine discharge instructions as discussed; follow up as advised.  Patient presented with an acute illness with associated systemic symptoms and significant discomfort requiring urgent management. In my opinion, this is a condition that a prudent lay person (someone who possesses an average knowledge of health and medicine) may potentially expect to result in complications if not addressed urgently such as respiratory distress, impairment of  bodily function or dysfunction of bodily organs.   Routine symptom specific, illness specific and/or disease specific instructions were discussed with the patient and/or caregiver at length.   As such, the patient has been evaluated and assessed, work-up was performed and treatment was provided in alignment with urgent care protocols and evidence based medicine.  Patient/parent/caregiver has been advised that the patient may require follow up for further testing and treatment if the symptoms continue in spite of treatment, as clinically indicated and appropriate.  If the patient was tested for COVID-19, Influenza and/or RSV, then the patient/parent/guardian was advised to isolate at home pending the results of his/her diagnostic coronavirus test and potentially longer if theyre positive. I have also advised pt that if his/her COVID-19 test returns positive, it's recommended to self-isolate for at least 10 days after symptoms first appeared AND until fever-free for 24 hours without fever reducer AND other symptoms have improved or resolved. Discussed self-isolation recommendations as well as instructions for household member/close contacts as per the [facility] and [place] DHHS, and also gave patient the COVID packet with this information.  Patient/parent/caregiver has been advised to return to the [facility] or PCP in 3-5 days if no better; to PCP or the Emergency Department if new signs and symptoms develop, or if the current signs or symptoms continue to change or worsen for further workup, evaluation and treatment as clinically indicated and appropriate  The patient will follow up with their current PCP if and as advised. If the patient does not currently have a PCP we will assist them in obtaining one.   The patient may need specialty follow up if the symptoms continue, in spite of conservative treatment and management, for further workup, evaluation, consultation and treatment as clinically indicated and  appropriate.   Patient/parent/caregiver verbalized understanding and agreement of plan as discussed.  All questions were addressed during visit.  Please see discharge instructions below for further details of plan.  Discharge Instructions:   Discharge Instructions      Please begin Bactrim, 2 tablets twice daily for the next 7 days.  Please return to urgent care or go to the emergency room if you have not had significant improvement of the swelling and tenderness in your lip in the next 2 to 3 days.  Thank you for visiting urgent care today.    This office note has been dictated using Teaching laboratory technician.  Unfortunately, and despite my best efforts, this method of dictation can sometimes lead to occasional typographical or grammatical errors.  I apologize in advance if this occurs.     Theadora Rama Scales, PA-C 08/06/21 1558

## 2021-08-06 NOTE — Discharge Instructions (Addendum)
Please begin Bactrim, 2 tablets twice daily for the next 7 days.  Please return to urgent care or go to the emergency room if you have not had significant improvement of the swelling and tenderness in your lip in the next 2 to 3 days.  Thank you for visiting urgent care today.

## 2021-08-06 NOTE — ED Triage Notes (Signed)
Pt reports getting lip pierced and wants to make sure it is not infected (she reports seeing pus).

## 2021-08-07 ENCOUNTER — Other Ambulatory Visit: Payer: Self-pay

## 2021-08-07 ENCOUNTER — Emergency Department (HOSPITAL_BASED_OUTPATIENT_CLINIC_OR_DEPARTMENT_OTHER): Payer: 59 | Admitting: Radiology

## 2021-08-07 ENCOUNTER — Emergency Department (HOSPITAL_BASED_OUTPATIENT_CLINIC_OR_DEPARTMENT_OTHER)
Admission: EM | Admit: 2021-08-07 | Discharge: 2021-08-07 | Disposition: A | Discharge status: Home / Self Care | Payer: 59 | Attending: Emergency Medicine | Admitting: Emergency Medicine

## 2021-08-07 ENCOUNTER — Encounter (HOSPITAL_BASED_OUTPATIENT_CLINIC_OR_DEPARTMENT_OTHER): Payer: Self-pay

## 2021-08-07 DIAGNOSIS — I10 Essential (primary) hypertension: Secondary | ICD-10-CM | POA: Insufficient documentation

## 2021-08-07 DIAGNOSIS — R0789 Other chest pain: Secondary | ICD-10-CM | POA: Insufficient documentation

## 2021-08-07 DIAGNOSIS — R112 Nausea with vomiting, unspecified: Secondary | ICD-10-CM | POA: Diagnosis not present

## 2021-08-07 LAB — BASIC METABOLIC PANEL
Anion gap: 10 (ref 5–15)
BUN: 9 mg/dL (ref 6–20)
CO2: 23 mmol/L (ref 22–32)
Calcium: 9.7 mg/dL (ref 8.9–10.3)
Chloride: 105 mmol/L (ref 98–111)
Creatinine, Ser: 0.86 mg/dL (ref 0.44–1.00)
GFR, Estimated: >60 mL/min (ref >60)
Glucose, Bld: 83 mg/dL (ref 70–99)
Potassium: 3.9 mmol/L (ref 3.5–5.1)
Sodium: 138 mmol/L (ref 135–145)

## 2021-08-07 LAB — CBC
HCT: 38.4 % (ref 36.0–46.0)
Hemoglobin: 12.7 g/dL (ref 12.0–15.0)
MCH: 28.6 pg (ref 26.0–34.0)
MCHC: 33.1 g/dL (ref 30.0–36.0)
MCV: 86.5 fL (ref 80.0–100.0)
Platelets: 233 10*3/uL (ref 150–400)
RBC: 4.44 MIL/uL (ref 3.87–5.11)
RDW: 14.1 % (ref 11.5–15.5)
WBC: 8.6 10*3/uL (ref 4.0–10.5)
nRBC: 0 % (ref 0.0–0.2)

## 2021-08-07 LAB — TROPONIN I (HIGH SENSITIVITY): Troponin I (High Sensitivity): <2 ng/L (ref <18)

## 2021-08-07 LAB — PREGNANCY, URINE: Preg Test, Ur: NEGATIVE

## 2021-08-07 NOTE — ED Triage Notes (Signed)
Pt presents with multiple complaints, chest pain and headache x1 week, shoulder pain x2 days and vomited clear sputum this am

## 2021-08-07 NOTE — ED Provider Notes (Signed)
[facility] EMERGENCY DEPT Provider Note   CSN: [id number] Arrival date & time: 08/07/21  [id number]     History  Chief Complaint  Patient presents with   Chest Pain    Ashley Romero is a 26 y.o. female.  The history is provided by the patient and medical records. No language interpreter was used.  Chest Pain  26 year old female with significant history anxiety, hypertension, ADHD, GERD presenting with multiple complaints.  For the past 2 weeks patient endorsed not feeling well, she endorsed having pain in his chest, generalized fatigue, nauseous, vomiting, headache.  Chest pain is described as a pulling sensation to the left side of chest that happen sporadically.  She also reported having an infected lip ring that she was seen recently and was started on Bactrim as antibiotic.  She also admits to regular marijuana use.  She denies any significant cardiac history.  She does not complain of any fever, chills, runny nose, sneezing, coughing, abdominal pain, dysuria, hematuria.  Take warm showers seem to help with symptoms.  Home Medications Prior to Admission medications   Medication Sig Start Date End Date Taking? Authorizing Provider  carbamazepine (TEGRETOL XR) 100 MG 12 hr tablet Take 100 mg by mouth at bedtime. 05/11/19   [provider]  FLUoxetine (PROZAC) 20 MG capsule Take 20 mg by mouth daily. Not taking currently 05/09/19   [provider]  fluticasone (FLONASE) 50 MCG/ACT nasal spray Place 2 sprays into both nostrils daily. 06/03/20   Noe Gens, PA-C  hydrOXYzine (VISTARIL) 25 MG capsule Take by mouth. 05/03/18   [provider]  ibuprofen (ADVIL) 800 MG tablet Take 1 tablet (800 mg total) by mouth every 8 (eight) hours as needed. 10/11/20   Truddie Hidden, MD  lisdexamfetamine (VYVANSE) 30 MG capsule Take by mouth. 08/24/18   [provider]  mupirocin ointment (BACTROBAN) 2 % Apply to wound 3 times daily for 5 days 06/03/20    Noe Gens, PA-C  naproxen (NAPROSYN) 500 MG tablet Take 1 tablet (500 mg total) by mouth 2 (two) times daily. 09/18/19   Hall-Potvin, [place], PA-C  ondansetron (ZOFRAN ODT) 4 MG disintegrating tablet Take one tab by mouth Q6hr prn nausea 04/10/20   Kandra Nicolas, MD  pantoprazole (PROTONIX) 20 MG tablet Take 1 tablet (20 mg total) by mouth daily. 10/11/20   Truddie Hidden, MD  sulfamethoxazole-trimethoprim (BACTRIM DS) 800-160 MG tablet Take 2 tablets by mouth 2 (two) times daily for 7 days. 08/06/21 08/13/21  Lynden Oxford Scales, PA-C  traMADol (ULTRAM) 50 MG tablet Take 1 tablet (50 mg total) by mouth every 6 (six) hours as needed. 07/19/18   Noe Gens, PA-C  traZODone (DESYREL) 100 MG tablet Take by mouth. 04/29/18   [provider]      Allergies    Patient has no known allergies.    Review of Systems   Review of Systems  Cardiovascular:  Positive for chest pain.  All other systems reviewed and are negative.  Physical Exam Updated Vital Signs BP 117/81    Pulse (!) 55    Temp 98.2 F (36.8 C)    Resp 15    Ht 5' (1.524 m)    Wt 93.4 kg    LMP 08/05/2021    SpO2 100%    BMI 40.23 kg/m  Physical Exam Vitals and nursing note reviewed.  Constitutional:      General: She is not in acute distress.  Appearance: She is well-developed.  HENT:     Head: Atraumatic.  Eyes:     Conjunctiva/sclera: Conjunctivae normal.  Cardiovascular:     Rate and Rhythm: Normal rate and regular rhythm.     Pulses: Normal pulses.     Heart sounds: Normal heart sounds.  Pulmonary:     Effort: Pulmonary effort is normal.  Abdominal:     Palpations: Abdomen is soft.     Tenderness: There is no abdominal tenderness.  Musculoskeletal:     Cervical back: Neck supple.  Skin:    General: Skin is warm.     Findings: No rash.  Neurological:     Mental Status: She is alert. Mental status is at baseline.  Psychiatric:        Mood and Affect: Mood normal.    ED Results /  Procedures / Treatments   Labs (all labs ordered are listed, but only abnormal results are displayed) Labs Reviewed  BASIC METABOLIC PANEL  CBC  PREGNANCY, URINE  TROPONIN I (HIGH SENSITIVITY)  TROPONIN I (HIGH SENSITIVITY)    EKG EKG Interpretation  Date/Time:  Thursday August 07 2021 09:57:21 EST Ventricular Rate:  76 PR Interval:  138 QRS Duration: 88 QT Interval:  360 QTC Calculation: 405 R Axis:   74 Text Interpretation: Normal sinus rhythm Nonspecific T wave abnormality overall similar to April 2022 Confirmed by Sherwood Gambler [phone]) on 08/07/2021 10:53:59 AM  Radiology DG Chest 2 View  Result Date: 08/07/2021 CLINICAL DATA:  Shortness of breath with night sweats EXAM: CHEST - 2 VIEW COMPARISON:  10/11/2020 FINDINGS: Normal heart size. Right aortic arch. No detected adenopathy. There is no edema, consolidation, effusion, or pneumothorax. IMPRESSION: 1. Stable chest.  No evidence of active disease or adenopathy. 2. Right aortic arch. Electronically Signed   By: Jorje Guild M.D.   On: 08/07/2021 10:33    Procedures Procedures    Medications Ordered in ED Medications - No data to display  ED Course/ Medical Decision Making/ A&P                           Medical Decision Making Problems Addressed: Atypical chest pain: acute illness or injury    Details: low risk chest pain Nausea and vomiting, unspecified vomiting type: acute illness or injury    Details: take zofran at home avoid marijuana use  Amount and/or Complexity of Data Reviewed Independent Historian: friend External Data Reviewed: labs and notes. Labs: ordered. Decision-making details documented in ED Course. Radiology: ordered and independent interpretation performed. Decision-making details documented in ED Course. ECG/medicine tests: ordered and independent interpretation performed. Decision-making details documented in ED Course.   BP 117/81    Pulse (!) 55    Temp 98.2 F (36.8 C)    Resp 15     Ht 5' (1.524 m)    Wt 93.4 kg    LMP 08/05/2021    SpO2 100%    BMI 40.23 kg/m   34:54 PM 26 year old female presenting complaining of multiple complaints which includes chest pain feeling nauseous and vomiting as well as headache.  She also admits to regular marijuana use.  Patient also has an infected lip ring that she was recently seen yesterday for and was prescribed Bactrim as treatment.  Patient overall well-appearing, chest pain atypical for ACS. HEAR score of 1, low risk of MACE. She has a very benign abdominal exam.  Vital signs stable.  No hypoxia.  Work-up initiated.  Does have symptoms which may suggest cannabinoid hyperemesis syndrome with her recurrent nausea and vomiting.   This patient presents to the ED for concern of CP, this involves an extensive number of treatment options, and is a complaint that carries with it a high risk of complications and morbidity.  The differential diagnosis includes gastritis, MSK, ACS, PE, cannabinoid hyperemesis syndrome, pna, dissection  Co morbidities that complicate the patient evaluation anxiety  Marijuana use Additional history obtained:  Additional history obtained from significant other who is at bedside External records from outside source obtained and reviewed including note from [facility] from yesterday when she was seen for infected lip piercing and was prescribed bactrim  Lab Tests:  I Ordered, and personally interpreted labs.  The pertinent results include:  no negative labs  Imaging Studies ordered:  I ordered imaging studies including CXR I independently visualized and interpreted imaging which showed no active disease I agree with the radiologist interpretation  Cardiac Monitoring:  The patient was maintained on a cardiac monitor.  I personally viewed and interpreted the cardiac monitored which showed an underlying rhythm of: sinus rhythm  Medicines ordered and prescription drug management:   Test Considered: as  above  Critical Interventions: as above  Problem List / ED Course: chest pain  Reevaluation:  After the interventions noted above, I reevaluated the patient and found that they have :resolved  Social Determinants of Health: marijuana use  Dispostion:  After consideration of the diagnostic results and the patients response to treatment, I feel that the patent would benefit from abstaining from marijuana use and f/u with PCP.         Final Clinical Impression(s) / ED Diagnoses Final diagnoses:  Atypical chest pain  Nausea and vomiting, unspecified vomiting type    Rx / DC Orders ED Discharge Orders     None         Ashley Moras, PA-C 08/07/21 [place], MD 08/09/21 1006

## 2021-08-07 NOTE — ED Notes (Signed)
Pt just roomed

## 2021-08-07 NOTE — ED Notes (Signed)
Patient verbalizes understanding of discharge instructions. Opportunity for questioning and answers were provided. Patient discharged from ED.  °

## 2021-09-22 ENCOUNTER — Other Ambulatory Visit: Payer: Self-pay

## 2021-09-22 ENCOUNTER — Encounter: Payer: Self-pay | Admitting: Emergency Medicine

## 2021-09-22 ENCOUNTER — Emergency Department (INDEPENDENT_AMBULATORY_CARE_PROVIDER_SITE_OTHER)
Admission: EM | Admit: 2021-09-22 | Discharge: 2021-09-22 | Disposition: A | Discharge status: Home / Self Care | Payer: 59 | Source: Home / Self Care

## 2021-09-22 DIAGNOSIS — M79601 Pain in right arm: Secondary | ICD-10-CM | POA: Diagnosis not present

## 2021-09-22 MED ORDER — IBUPROFEN 800 MG PO TABS: 800.0000 mg | ORAL_TABLET | Freq: Three times a day (TID) | ORAL | 0 refills | Status: DC | PRN

## 2021-09-22 MED ORDER — TIZANIDINE HCL 4 MG PO TABS: 4.0000 mg | ORAL_TABLET | Freq: Four times a day (QID) | ORAL | 0 refills | Status: DC | PRN
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
## 2021-09-22 NOTE — Discharge Instructions (Signed)
I believe your arm is just bruised ?Put ice on it for 20 minutes ?Take ibuprofen 3 times a day as needed for pain ?No heavy use of arm until pain improves ?Take muscle relaxer if needed at night for muscle pain ?Follow-up with your family doctor if not improving by next week ?
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
## 2021-10-01 ENCOUNTER — Emergency Department (HOSPITAL_COMMUNITY)
Admission: EM | Admit: 2021-10-01 | Discharge: 2021-10-02 | Disposition: A | Discharge status: Other Acute Inpatient Hospital | Payer: 59 | Attending: Emergency Medicine | Admitting: Emergency Medicine

## 2021-10-01 DIAGNOSIS — R400 Somnolence: Secondary | ICD-10-CM | POA: Diagnosis not present

## 2021-10-01 DIAGNOSIS — Z20822 Contact with and (suspected) exposure to covid-19: Secondary | ICD-10-CM | POA: Insufficient documentation

## 2021-10-01 DIAGNOSIS — T50902A Poisoning by unspecified drugs, medicaments and biological substances, intentional self-harm, initial encounter: Secondary | ICD-10-CM

## 2021-10-01 DIAGNOSIS — F419 Anxiety disorder, unspecified: Secondary | ICD-10-CM | POA: Diagnosis not present

## 2021-10-01 DIAGNOSIS — Z046 Encounter for general psychiatric examination, requested by authority: Secondary | ICD-10-CM | POA: Insufficient documentation

## 2021-10-01 DIAGNOSIS — T424X1A Poisoning by benzodiazepines, accidental (unintentional), initial encounter: Secondary | ICD-10-CM | POA: Insufficient documentation

## 2021-10-01 DIAGNOSIS — F329 Major depressive disorder, single episode, unspecified: Secondary | ICD-10-CM | POA: Insufficient documentation

## 2021-10-01 LAB — RAPID URINE DRUG SCREEN, HOSP PERFORMED
Amphetamines: NOT DETECTED
Barbiturates: NOT DETECTED
Benzodiazepines: POSITIVE — AB
Cocaine: NOT DETECTED
Opiates: NOT DETECTED
Tetrahydrocannabinol: POSITIVE — AB

## 2021-10-01 LAB — COMPREHENSIVE METABOLIC PANEL
ALT: 13 U/L (ref 0–44)
AST: 14 U/L — ABNORMAL LOW (ref 15–41)
Albumin: 4.1 g/dL (ref 3.5–5.0)
Alkaline Phosphatase: 46 U/L (ref 38–126)
Anion gap: 8 (ref 5–15)
BUN: 10 mg/dL (ref 6–20)
CO2: 22 mmol/L (ref 22–32)
Calcium: 9.3 mg/dL (ref 8.9–10.3)
Chloride: 110 mmol/L (ref 98–111)
Creatinine, Ser: 0.84 mg/dL (ref 0.44–1.00)
GFR, Estimated: >60 mL/min (ref >60)
Glucose, Bld: 114 mg/dL — ABNORMAL HIGH (ref 70–99)
Potassium: 3.3 mmol/L — ABNORMAL LOW (ref 3.5–5.1)
Sodium: 140 mmol/L (ref 135–145)
Total Bilirubin: 0.6 mg/dL (ref 0.3–1.2)
Total Protein: 7.8 g/dL (ref 6.5–8.1)

## 2021-10-01 LAB — URINALYSIS, ROUTINE W REFLEX MICROSCOPIC
Bilirubin Urine: NEGATIVE
Glucose, UA: NEGATIVE mg/dL
Ketones, ur: 80 mg/dL — AB
Leukocytes,Ua: NEGATIVE
Nitrite: NEGATIVE
Protein, ur: 30 mg/dL — AB
RBC / HPF: >50 RBC/hpf — ABNORMAL HIGH (ref 0–5)
Specific Gravity, Urine: 1.019 (ref 1.005–1.030)
pH: 6 (ref 5.0–8.0)

## 2021-10-01 LAB — CBC WITH DIFFERENTIAL/PLATELET
Abs Immature Granulocytes: 0.04 10*3/uL (ref 0.00–0.07)
Basophils Absolute: 0 10*3/uL (ref 0.0–0.1)
Basophils Relative: 0 %
Eosinophils Absolute: 0 10*3/uL (ref 0.0–0.5)
Eosinophils Relative: 0 %
HCT: 36.7 % (ref 36.0–46.0)
Hemoglobin: 12.6 g/dL (ref 12.0–15.0)
Immature Granulocytes: 1 %
Lymphocytes Relative: 16 %
Lymphs Abs: 1.4 10*3/uL (ref 0.7–4.0)
MCH: 30 pg (ref 26.0–34.0)
MCHC: 34.3 g/dL (ref 30.0–36.0)
MCV: 87.4 fL (ref 80.0–100.0)
Monocytes Absolute: 0.5 10*3/uL (ref 0.1–1.0)
Monocytes Relative: 5 %
Neutro Abs: 6.8 10*3/uL (ref 1.7–7.7)
Neutrophils Relative %: 78 %
Platelets: 222 10*3/uL (ref 150–400)
RBC: 4.2 MIL/uL (ref 3.87–5.11)
RDW: 13.9 % (ref 11.5–15.5)
WBC: 8.7 10*3/uL (ref 4.0–10.5)
nRBC: 0 % (ref 0.0–0.2)

## 2021-10-01 LAB — SALICYLATE LEVEL: Salicylate Lvl: <7 mg/dL — ABNORMAL LOW (ref 7.0–30.0)

## 2021-10-01 LAB — RESP PANEL BY RT-PCR (FLU A&B, COVID) ARPGX2
Influenza A by PCR: NEGATIVE
Influenza B by PCR: NEGATIVE
SARS Coronavirus 2 by RT PCR: NEGATIVE

## 2021-10-01 LAB — I-STAT BETA HCG BLOOD, ED (MC, WL, AP ONLY): I-stat hCG, quantitative: <5 m[IU]/mL (ref <5)

## 2021-10-01 LAB — ETHANOL: Alcohol, Ethyl (B): <10 mg/dL (ref <10)

## 2021-10-01 LAB — ACETAMINOPHEN LEVEL: Acetaminophen (Tylenol), Serum: <10 ug/mL — ABNORMAL LOW (ref 10–30)

## 2021-10-01 MED ORDER — SODIUM CHLORIDE 0.9 % IV BOLUS
1000.0000 mL | Freq: Once | INTRAVENOUS | Status: AC
  Administered 2021-10-01: 1000 mL via INTRAVENOUS

## 2021-10-01 NOTE — ED Notes (Signed)
Pt remains unsteady on her feet.  Needs one person minimum to ambulate safely. Bed alarm turned on and staff made aware.  ?
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
## 2021-10-01 NOTE — ED Notes (Signed)
Patient Belongings are: ? ?1-Pair of black shoes ?1-Black shirt ?1-Sponge bob PJ's (yellow) ?1-Pink bra ? ?Nurse aware ?

## 2021-10-01 NOTE — ED Notes (Addendum)
Patient tearful while being aggressive stated she was going to leave even after she was explained that she was IVC'd. She threw the phone and started to escalate. Security presence during this incident. Spoke with Dr. Donnald Garre and and she came to see patient right away to assess. She stated that she would place order for restraints if patient continue to escalate and not be cooperative.  ? ?Patient started yelling and kicking the bed multiple times. New order for soft restraints ordered and placed. Explained to patient that we would be able to remove restraints if she was calm and cooperative with her care ? ?2300 - patient is now calm and resting comfortably in bed. Restraints have been removed. Will continue to monitor.  ? ?

## 2021-10-01 NOTE — ED Notes (Signed)
Pt. Belongings are labeled in cabinet 5-8 section. Nurse aware.  ?

## 2021-10-01 NOTE — ED Provider Notes (Signed)
Emergency Medicine Observation Re-evaluation Note ? ?Ashley Romero is a 26 y.o. female, seen on rounds today.  Pt initially presented to the ED for complaints of Drug Overdose ?Currently, the patient is waiting TTS consult.  Patient was seen by Dr. Dina Rich.  At that time, patient was agreeable to TTS consult and Dr. Dina Rich did not do IVC.  However at shift change Dr. Dina Rich did advise that the patient would need to have IVC if she decided to leave without completing evaluation. ? ?Patient has escalated and become belligerent with staff.  She has thrown the phone and insisted on leaving.  She is awake at this time but her speech continues to be slightly slurred consistent with ongoing effects of Xanax.  Nursing staff also reports that although patient can get up and ambulate she is somewhat unsteady and still poses potential fall risk. ? ?Patient is now insisting that is her mother who had initiated this evaluation and she did not want her mother to have control over her however at the same time she is insisting that she speak with her mother. ? ?Physical Exam  ?BP (!) 128/106   Pulse 93   Temp 98 ?F (36.7 ?C) (Oral)   Resp 16   Ht 5' (1.524 m)   Wt 86.2 kg   LMP 09/03/2021 (Approximate)   SpO2 100%   BMI 37.11 kg/m?  ?Physical Exam ?General: Patient is awake sitting on the edge of the bed and speaking with myself and staff.  She is situationally aware.  Her movements are purposeful.  Speech remains slightly slurred consistent with ongoing effects of sedative medication. ?ED Course / MDM  ?EKG:EKG Interpretation ? ?Date/Time:  Wednesday October 01 2021 14:31:37 EDT ?Ventricular Rate:  99 ?PR Interval:  188 ?QRS Duration: 79 ?QT Interval:  328 ?QTC Calculation: 421 ?R Axis:   72 ?Text Interpretation: Sinus rhythm Normal intervals Confirmed by Lavenia Atlas [phone]) on 10/01/2021 5:30:26 PM ? ?I have reviewed the labs performed to date as well as medications administered while in observation.  Recent changes in  the last 24 hours include change to IVC. ? ?Plan  ?Current plan is for TTS consult.  Now that patient has become more awake after intentional overdose on Xanax, she is escalating in belligerent behavior and insisting on leaving.  Patient is not safe for psychiatric clearance without TTS consult.  Patient initially admitted to her mother of intentionally overdosing on Xanax.  Patient continues to exhibit signs of mild intoxication consistent with her overdose.  She does not have any medical risk at this time of respiratory depression or vital sign instability.  However, with ongoing signs of effects of Xanax she is not safe to be discharged without completing psychiatric evaluation when cleared from the effects of the days of benzodiazepine intoxication. ?Ashley Romero is under involuntary commitment. ?  ? ?  ?Charlesetta Shanks, MD ?10/01/21 2035 ? ?

## 2021-10-01 NOTE — ED Triage Notes (Signed)
Ems brings pt in from home for drug overdose. Pt was texting mother saying she was going to hurt herself. Pt reports taking 2 handfuls of xanax around 1140 today. ?

## 2021-10-01 NOTE — ED Provider Notes (Addendum)
?[facility]-EMERGENCY DEPT ?Provider Note ? ? ?CSN: [id number] ?Arrival date & time: 10/01/21  1252 ? ?  ? ?History ? ?Chief Complaint  ?Patient presents with  ? Drug Overdose  ? ? ?Ashley Romero is a 26 y.o. female. ? ?HPI ? ? 26 year old female presents to the ER by EMS for concern of drug OD. Patient reports that she took a handful of xanax pills this morning at 800am.  The pills were reportedly the mother's.  EMS brought in an empty pillbox with no label and no remaining pills.  The mother had called EMS after she received a text from the daughter saying that she wanted to hurt herself.  Patient cannot specify exactly how many pills a "handful" is.  Patient denies any additional alcohol, drugs, Tylenol, salicylate.  She states that she took this handful and attempt to "relax her nerves" because she has been very anxious lately.  She does not specifically say no to if she was trying to hurt herself/commit suicide.  She states she has been depressed but no specific suicidal thoughts/plan.  Patient states she has never tried anything like this before.  Currently she is complaining of drowsiness. ? ?Home Medications ?Prior to Admission medications   ?Medication Sig Start Date End Date Taking? Authorizing Provider  ?FLUoxetine (PROZAC) 20 MG capsule Take 20 mg by mouth daily. Not taking currently 05/09/19   [provider]  ?ibuprofen (ADVIL) 800 MG tablet Take 1 tablet (800 mg total) by mouth every 8 (eight) hours as needed. 09/22/21   Eustace Moore, MD  ?ondansetron [facility] ODT) 4 MG disintegrating tablet Take one tab by mouth Q6hr prn nausea 04/10/20   Lattie Haw, MD  ?tiZANidine (ZANAFLEX) 4 MG tablet Take 1-2 tablets (4-8 mg total) by mouth every 6 (six) hours as needed for muscle spasms. 09/22/21   Eustace Moore, MD  ?   ? ?Allergies    ?Patient has no known allergies.   ? ?Review of Systems   ?Review of Systems  ?Respiratory:  Negative for shortness of breath.    ?Cardiovascular:  Negative for chest pain.  ?Gastrointestinal:  Negative for abdominal pain.  ?Musculoskeletal:  Negative for back pain.  ?Neurological:  Negative for headaches.  ?Psychiatric/Behavioral:  Positive for sleep disturbance. The patient is nervous/anxious.   ?     + depression  ? ?Physical Exam ?Updated Vital Signs ?BP (!) 128/106   Pulse 93   Temp 98 ?F (36.7 ?C) (Oral)   Resp 16   Ht 5' (1.524 m)   Wt 86.2 kg   LMP 09/03/2021 (Approximate)   SpO2 100%   BMI 37.11 kg/m?  ?Physical Exam ?Constitutional:   ?   Appearance: She is not diaphoretic.  ?   Comments: + drowsy but responsive  ?HENT:  ?   Head: Normocephalic and atraumatic.  ?   Right Ear: External ear normal.  ?   Left Ear: External ear normal.  ?Eyes:  ?   Extraocular Movements: Extraocular movements intact.  ?   Pupils: Pupils are equal, round, and reactive to light.  ?Musculoskeletal:     ?   General: No swelling or deformity.  ?   Cervical back: No rigidity.  ?Neurological:  ?   Mental Status: She is disoriented.  ?   Comments: Follows commands, responsive but drowsy  ?Psychiatric:  ?   Comments: Avoid eye contact, flat affect  ? ? ?ED Results / Procedures / Treatments   ?  Labs ?(all labs ordered are listed, but only abnormal results are displayed) ?Labs Reviewed  ?COMPREHENSIVE METABOLIC PANEL - Abnormal; Notable for the following components:  ?    Result Value  ? Potassium 3.3 (*)   ? Glucose, Bld 114 (*)   ? AST 14 (*)   ? All other components within normal limits  ?SALICYLATE LEVEL - Abnormal; Notable for the following components:  ? Salicylate Lvl <7.0 (*)   ? All other components within normal limits  ?ACETAMINOPHEN LEVEL - Abnormal; Notable for the following components:  ? Acetaminophen (Tylenol), Serum <10 (*)   ? All other components within normal limits  ?CBC WITH DIFFERENTIAL/PLATELET  ?URINALYSIS, ROUTINE W REFLEX MICROSCOPIC  ?RAPID URINE DRUG SCREEN, HOSP PERFORMED  ?ETHANOL  ?I-STAT BETA HCG BLOOD, ED ([facility], [facility], [facility] ONLY)   ? ? ?EKG ?None ? ?Radiology ?No results found. ? ?Procedures ?Procedures  ? ? ?Medications Ordered in ED ?Medications  ?sodium chloride 0.9 % bolus 1,000 mL (has no administration in time range)  ? ? ?ED Course/ Medical Decision Making/ A&P ?  ?                        ?Medical Decision Making ?Amount and/or Complexity of Data Reviewed ?Labs: ordered. ? ? ?26 year old female presents emergency department with admitted Xanax overdose.  Patient took "a handful of pills".  States that she was just trying to calm down but did not care if she hurt herself.  No specific admitted suicidal ideation.  The Xanax was reportedly her mother's medication.  Patient arrives drowsy.  States that the ingestion was around 8 AM.  Vitals are otherwise stable. ? ?Work-up is reassuring, acetaminophen and salicylate and ethanol are negative.  No urinalysis/UDS yet.  EKG shows normal rate, normal intervals.  Patient's drowsiness has improved, she is now awake and more conversational.  No signs of acute intoxication/overdose/toxidrome. ? ?On reevaluation patient is able to eat and drink. Patient observed for more than 10 hours post reported ingestion. No new symptoms or worsening sedation. Tox work up otherwise negative. Presentation and drowsiness consistent with Xanax ingestion. She is improved, instructed she will talk with TTS for evaluation. Patient is medically cleared. Plan for TTS evaluation for overdose with intention for self-harm.  Patient is currently voluntary and wants to speak with TTS.  If she was to try to leave I would place her under IVC.  Patient will be signed out to provider Default. ? ? ? ?Final Clinical Impression(s) / ED Diagnoses ?Final diagnoses:  ?None  ? ? ?Rx / DC Orders ?ED Discharge Orders   ? ? None  ? ?  ? ? ?  ?Rozelle Logan, DO ?10/01/21 1732 ? ?  ?Rozelle Logan, DO ?10/02/21 7564 ? ?

## 2021-10-01 NOTE — BH Assessment (Signed)
@  1820 Patient presents status post overdose.  Medical clearance labs ordered - have not been drawn. Patient is not medically cleared at this time.  ?

## 2021-10-02 ENCOUNTER — Other Ambulatory Visit: Payer: Self-pay

## 2021-10-02 NOTE — BH Assessment (Addendum)
Comprehensive Clinical Assessment (CCA) Note ? ?10/02/2021 ?Ashley Romero ?[id number] ? ?Disposition: ?Ashley Conn, NP, patient meets inpatient criteria. Selena Batten [facility] will review for [facility]. Disposition SW will secure placement in the AM. Kirstin, RN, informed of disposition. ? ?The patient demonstrates the following risk factors for suicide: Chronic risk factors for suicide include: psychiatric disorder of depression and previous suicide attempts today attempted overdose on medication . Acute risk factors for suicide include: N/A. Protective factors for this patient include: positive social support, responsibility to others (children, family), coping skills, and hope for the future. Considering these factors, the overall suicide risk at this point appears to be high. Patient is not appropriate for outpatient follow up. ? ?Flowsheet Row ED from 09/22/2021 in [facility] at [facility] ED from 08/07/2021 in [facility] Emergency [facility] ED from 08/06/2021 in [facility] Urgent Care at [facility]  ?C-SSRS RISK CATEGORY No Risk No Risk No Risk  ? ?  ? ? ?Willean Schurman is a 26 year old female presenting under IVC to [facility] due to SI with attempted overdose. Patient denied suicide attempt with TTS clinician. Patient denied information on IVC. Patient reported "I only took 2 Xanax, I just wanted to scare my mother on purpose". Patient reported main stressors is not having patience. Patient reported being a "little depressed". Patient was not forthcoming with information. Patient denied prior suicide attempts, self-harming behaviors and psych hospitalizations. Patient reported being on "probation for assault with a deadly weapon 5 years ago". Patient was cooperative during assessment. Patient denied collateral contact at this time. ? ?PER EDP REPORT 10/01/2021: ?Patient reports that she took a handful of xanax pills this morning at 800am.  The pills were reportedly the mother's.  EMS brought in an empty  pillbox with no label and no remaining pills.  The mother had called EMS after she received a text from the daughter saying that she wanted to hurt herself.  Patient cannot specify exactly how many pills a "handful" is.  Patient denies any additional alcohol, drugs, Tylenol, salicylate.  She states that she took this handful and attempt to "relax her nerves" because she has been very anxious lately.  She does not specifically say no to if she was trying to hurt herself/commit suicide.  She states she has been depressed but no specific suicidal thoughts/plan.  Patient states she has never tried anything like this before.  Currently she is complaining of drowsiness. ? ?PER IVC, Janee Morn, BHRT Counselor, petitioner [phone]. 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 She has been saying she wanted to kill herself. ?Officers found her unresponsive in her bed for several minutes.  ? ?Chief Complaint:  ?Chief Complaint  ?Patient presents with  ? Drug Overdose  ? ?Visit Diagnosis:  ?Major depressive disorder ? ? ?CCA Screening, Triage and Referral (STR) ? ?Patient Reported Information ?How did you hear about [place]? Legal System ? ?What Is the Reason for Your Visit/Call Today? Attempted overdose ? ?How Long Has This Been Causing You Problems? 1 wk - 1 month ? ?What Do You Feel Would Help You the Most Today? Treatment for Depression or other mood problem ? ? ?Have You Recently Had Any Thoughts About Hurting Yourself? No ? ?Are You Planning to Commit Suicide/Harm Yourself At This time? No ? ? ?Have you Recently Had  Thoughts About Hurting Someone Karolee Ohs? No ? ?Are You Planning to Harm Someone at This Time? No ? ?Explanation: No data recorded ? ?Have You Used Any Alcohol or Drugs in the Past 24 Hours? Yes ? ?How Long Ago Did  You Use Drugs or Alcohol? No data recorded ?What Did You Use and How Much? marijuana daily ? ? ?Do You Currently Have a Therapist/Psychiatrist? No ? ?Name of Therapist/Psychiatrist: No data recorded ? ?Have You Been Recently Discharged From Any Office Practice or Programs? No ? ?Explanation of Discharge From Practice/Program: No data recorded ? ?  ?CCA Screening Triage Referral Assessment ?Type of Contact: Tele-Assessment ? ?Telemedicine Service Delivery:   ?Is this Initial or Reassessment? Initial Assessment ? ?Date Telepsych consult ordered in [facility]:  10/01/21 ? ?Time Telepsych consult ordered in [facility]:  1728 ? ?Location of Assessment: [facility] ? ?Provider Location: [facility] Assessment Services ? ? ?Collateral Involvement: none reported ? ? ?Does Patient Have a Automotive engineer Guardian? No data recorded ?Name and Contact of Legal Guardian: No data recorded ?If Minor and Not Living with Parent(s), Who has Custody? No data recorded ?Is CPS involved or ever been involved? Never ? ?Is APS involved or ever been involved? Never ? ? ?Patient Determined To Be At Risk for Harm To Self or Others Based on Review of Patient Reported Information or Presenting Complaint? No data recorded ?Method: No data recorded ?Availability of Means: No data recorded ?Intent: No data recorded ?Notification Required: No data recorded ?Additional Information for Danger to Others Potential: No data recorded ?Additional Comments for Danger to Others Potential: No data recorded ?Are There Guns or Other Weapons in Your Home? No data recorded ?Types of Guns/Weapons: No data recorded ?Are These Weapons Safely Secured?                            No data recorded ?Who Could Verify You Are Able To Have These Secured: No data recorded ?Do You Have any Outstanding Charges, Pending Court Dates, Parole/Probation? No data recorded ?Contacted To Inform of Risk of Harm To Self or Others: No data recorded ? ? ?Does Patient Present under Involuntary Commitment?  Yes ? ?IVC Papers Initial File Date: 10/01/21 ? ? ?[place] of Residence: [place] ? ? ?Patient Currently Receiving the Following Services: Not Receiving Services ? ? ?Determination of Need: Emergent (2 hours) ? ? ?Options For Referral: Medication Management; Outpatient Therapy; Inpatient Hospitalization ? ? ? ? ?CCA Biopsychosocial ?Patient Reported Schizophrenia/Schizoaffective Diagnosis in Past: No data recorded ? ?Strengths: uta ? ? ?Mental Health Symptoms ?Depression:   ?-- ("little depression") ?  ?Duration of Depressive symptoms:    ?Mania:   ?None ?  ?Anxiety:    ?None ?  ?Psychosis:   ?None ?  ?Duration of Psychotic symptoms:    ?Trauma:   ?None ?  ?Obsessions:   ?None ?  ?Compulsions:   ?None ?  ?Inattention:   ?None ?  ?Hyperactivity/Impulsivity:   ?None ?  ?Oppositional/Defiant Behaviors:   ?None ?  ?Emotional Irregularity:   ?None ?  ?Other Mood/Personality Symptoms:  No data recorded  ? ?Mental Status Exam ?Appearance and self-care  ?Stature:   ?Average ?  ?Weight:   ?Average weight ?  ?Clothing:   ?Age-appropriate ?  ?Grooming:   ?Normal ?  ?Cosmetic use:   ?None ?  ?Posture/gait:   ?Normal ?  ?Motor activity:   ?Not Remarkable ?  ?Sensorium  ?Attention:   ?Normal (drowsy) ?  ?  Concentration:   ?-- (drowsy) ?  ?Orientation:   ?Time; Situation; Place; Person ?  ?Recall/memory:   ?Normal ?  ?Affect and Mood  ?Affect:   ?Depressed ?  ?Mood:   ?Depressed ?  ?Relating  ?Eye contact:   ?Normal ?  ?Facial expression:   ?Sad ?  ?Attitude toward examiner:   ?Cooperative ?  ?Thought and Language  ?Speech flow:  ?Slow; Soft ?  ?Thought content:   ?Appropriate to Mood and Circumstances ?  ?Preoccupation:   ?None ?  ?Hallucinations:   ?None ?  ?Organization:  No data recorded  ?Executive Functions  ?Fund of Knowledge:   ?Average ?  ?Intelligence:   ?Average ?  ?Abstraction:  No data recorded  ?Judgement:   ?Dangerous ?  ?Reality Testing:  No data recorded  ?Insight:   ?Poor ?  ?Decision Making:   ?Impulsive ?   ?Social Functioning  ?Social Maturity:   ?Impulsive ?  ?Social Judgement:   ?Heedless ?  ?Stress  ?Stressors:   ?Family conflict; Relationship ?  ?Coping Ability:   ?Overwhelmed ?  ?Skill Deficits:   ?Self-control;

## 2021-10-02 NOTE — Progress Notes (Signed)
Patient has been denied by [facility] due to no appropriate beds available. Patient meets [place] inpatient criteria per Lindon Romp, NP. Patient has been faxed out to the following facilities:  ? ? ?[facility]  [address] [place] [address] [phone]  ?[facility]  [address], [place] [address] [phone]  ?[place] [place] [address] [phone]  ?[place] [address], [place] [address] [phone]  ?[facility]  [place], [place] [address] [phone]  ?[facility]  [address], [place] [address] [phone]  ?[facility]  [address] [place] [address] [phone]  ?[place] [address] [place] [address] [phone]  ?[facility]  [address] [place] [address] [phone]  ?[facility]  [address], [place] [address] [phone]  ?[facility]  [address] [place] [address] [phone]  ? ?Mariea Clonts, MSW, LCSW-A  ?11:47 AM 10/02/2021   ?

## 2021-10-02 NOTE — ED Notes (Signed)
Patient DC d off unit to facility per provider. Patient alert calm, cooperative, no s/s of distress. DC information and belongings given to sheriff for transport, Patient transported by sheriff.  ?

## 2021-10-02 NOTE — Progress Notes (Signed)
[facility] LCSW Progress Note ?  ?10/02/2021    2:24 PM ? ?Ashley Romero  ? ?[id number]  ? ?Type of Contact and Topic:  Psychiatric Bed Placement  ? ?Pt accepted to [facility]  ? ?Patient meets inpatient criteria per Nira Conn, NP ? ?The attending provider will be Dr. Forrestine Him ? ?Call report to [phone]  ? ?Lum Babe, RN @ [facility] ED notified.    ? ?Pt scheduled  to arrive at [facility] TODAY.  ? ? ?Damita Dunnings, MSW, LCSW-A  ?2:25 PM 10/02/2021   ?  ? ?  ?  ? ? ? ? ?  ?

## 2022-06-10 DIAGNOSIS — F411 Generalized anxiety disorder: Secondary | ICD-10-CM

## 2022-06-10 DIAGNOSIS — F331 Major depressive disorder, recurrent, moderate: Secondary | ICD-10-CM | POA: Insufficient documentation

## 2022-06-10 HISTORY — DX: Generalized anxiety disorder: F41.1

## 2022-09-02 ENCOUNTER — Ambulatory Visit: Admit: 2022-09-02 | Discharge status: Absent without leave | Payer: 59

## 2022-09-14 ENCOUNTER — Ambulatory Visit: Payer: 59

## 2022-09-14 ENCOUNTER — Encounter: Payer: Self-pay | Admitting: *Deleted

## 2022-09-14 ENCOUNTER — Ambulatory Visit: Admit: 2022-09-14 | Payer: 59

## 2022-09-14 ENCOUNTER — Ambulatory Visit (INDEPENDENT_AMBULATORY_CARE_PROVIDER_SITE_OTHER): Payer: 59

## 2022-09-14 ENCOUNTER — Ambulatory Visit
Admission: EM | Admit: 2022-09-14 | Discharge: 2022-09-14 | Disposition: A | Discharge status: Home / Self Care | Payer: 59 | Attending: Family Medicine | Admitting: Family Medicine

## 2022-09-14 DIAGNOSIS — S59902A Unspecified injury of left elbow, initial encounter: Secondary | ICD-10-CM

## 2022-09-14 DIAGNOSIS — M25522 Pain in left elbow: Secondary | ICD-10-CM

## 2022-09-14 DIAGNOSIS — W19XXXA Unspecified fall, initial encounter: Secondary | ICD-10-CM

## 2022-09-14 NOTE — ED Provider Notes (Signed)
[facility]    CSN: [id number] Arrival date & time: 09/14/22  0900      History   Chief Complaint Chief Complaint  Patient presents with   Arm Injury    LEFT Problems bending & pain in left elbow/arm due to fall downstairs - Entered by patient    HPI Ashley Romero is a 27 y.o. female.   HPI patient reports falling down stairs 1 week ago and landing on her left elbow.  Reports no previous injuries and no swelling.  Reports taking OTC ibuprofen as needed.  PMH significant for obesity, mood disorder, and ADHD.  Past Medical History:  Diagnosis Date   ADHD (attention deficit hyperactivity disorder)    Anemia    Anxiety    Constipation    Depression    GERD (gastroesophageal reflux disease)    OTC as needed   Gestational diabetes    Hypertension    Hypertrophy of tonsils and adenoids 08/2013   snores during sleep; had sleep study 03/2013:  AHI 1.9, RDI 2.3   Irregular menses    Nexplanon implant   Learning disorder involving mathematics    Migraines     Patient Active Problem List   Diagnosis Date Noted   History of severe pre-eclampsia 10/26/2016   History of gestational diabetes 10/26/2016   Eczema 06/18/2016   Morbid obesity with BMI of 50.0-59.9, adult ([place]) 04/21/2016   Preeclampsia, severe 01/16/2016   Influenza 09/06/2015   PCOS (polycystic ovarian syndrome) 06/10/2015   Gastroenteritis 04/30/2015   Chronic daily headache 03/22/2015   Migraine 03/22/2015   Sleep apnea 03/22/2015   Bipolar I disorder, most recent episode depressed ([place]) 01/13/2015   Depressive disorder 01/10/2015   Acanthosis nigricans 06/25/2014   Seborrheic dermatitis 06/01/2014   Iron deficiency anemia 01/30/2014   Central auditory processing disorder 01/03/2014   School problem 08/25/2013   Acne vulgaris 05/02/2013   Fatigue 05/02/2013   Headache(784.0) 11/03/2012   BMI (body mass index), pediatric, > 99% for age 41/01/2013   GERD (gastroesophageal reflux disease)  11/03/2012   Allergic rhinitis 11/03/2012   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   Constipation    ADHD (attention deficit hyperactivity disorder) 06/26/2011   Mood disorder ([place]) 06/26/2011    Past Surgical History:  Procedure Laterality Date   CESAREAN SECTION N/A 01/18/2016   Procedure: CESAREAN SECTION;  Surgeon: Janyth Pupa, DO;  Location: [facility];  Service: Obstetrics;  Laterality: N/A;   TONSILLECTOMY AND ADENOIDECTOMY Bilateral 09/25/2013   Procedure: BILATERAL TONSILLECTOMY AND ADENOIDECTOMY;  Surgeon: Ascencion Dike, MD;  Location: [facility];  Service: ENT;  Laterality: Bilateral;   WISDOM TOOTH EXTRACTION  2015    OB History     Gravida  1   Para  1   Term  1   Preterm      AB      Living  1      SAB      IAB      Ectopic      Multiple  0   Live Births  1            Home Medications    Prior to Admission medications   Not on File    Family History Family History  Problem Relation Age of Onset   Asthma Mother    Hypertension Mother    Anesthesia problems Mother        hard to wake up post-op   Migraines Mother  Depression Mother    Anxiety disorder Mother    Heart disease Maternal Grandmother    Migraines Maternal Grandmother    Bipolar disorder Maternal Aunt    Schizophrenia Maternal Aunt    Depression Maternal Aunt    ADD / ADHD Cousin        Many paternal 1 st cousins have ADD/ADHD    Social History Social History   Tobacco Use   Smoking status: Former    Packs/day: .25    Types: Cigarettes    Quit date: 10/07/2017    Years since quitting: 4.9   Smokeless tobacco: Never   Tobacco comments:    outside smokers at home  Vaping Use   Vaping Use: Never used  Substance Use Topics   Alcohol use: No    Alcohol/week: 0.0 standard drinks of alcohol   Drug use: Yes    Types: Marijuana    Comment: smokes marijuana daily      Allergies   Patient has no known allergies.   Review of Systems Review of  Systems  Musculoskeletal:        Left elbow pain x 1 week secondary to fall at home  All other systems reviewed and are negative.    Physical Exam Triage Vital Signs ED Triage Vitals  Enc Vitals Group     BP      Pulse      Resp      Temp      Temp src      SpO2      Weight      Height      Head Circumference      Peak Flow      Pain Score      Pain Loc      Pain Edu?      Excl. in [place]?    No data found.  Updated Vital Signs BP 115/75   Pulse 99   Temp 98 F (36.7 C) (Oral)   Resp 16   Ht 5' (1.524 m)   Wt 180 lb (81.6 kg)   LMP 08/28/2022   SpO2 97%   BMI 35.15 kg/m      Physical Exam Vitals and nursing note reviewed.  Constitutional:      Appearance: Normal appearance. She is obese.  HENT:     Head: Normocephalic and atraumatic.     Mouth/Throat:     Mouth: Mucous membranes are moist.     Pharynx: Oropharynx is clear.  Eyes:     Extraocular Movements: Extraocular movements intact.     Conjunctiva/sclera: Conjunctivae normal.     Pupils: Pupils are equal, round, and reactive to light.  Cardiovascular:     Rate and Rhythm: Normal rate and regular rhythm.     Pulses: Normal pulses.     Heart sounds: Normal heart sounds.  Pulmonary:     Effort: Pulmonary effort is normal.     Breath sounds: Normal breath sounds. No wheezing, rhonchi or rales.  Musculoskeletal:        General: Normal range of motion.     Cervical back: Normal range of motion and neck supple.     Comments: Left Elbow: Mildly TTP over olecranon, full range of motion with flexion and extension, grip 5/5, neurovascular/neurosensory intact, brisk cap refill  Skin:    General: Skin is warm and dry.  Neurological:     General: No focal deficit present.     Mental Status: She is alert and oriented to  person, place, and time. Mental status is at baseline.      UC Treatments / Results  Labs (all labs ordered are listed, but only abnormal results are displayed) Labs Reviewed - No data  to display  EKG   Radiology DG Elbow Complete Left  Result Date: 09/14/2022 CLINICAL DATA:  Trauma, fall, pain EXAM: LEFT ELBOW - COMPLETE 3+ VIEW COMPARISON:  03/28/2007 FINDINGS: No fracture or dislocation is seen. There is no displacement of posterior fat pad. There are no opaque foreign bodies. IMPRESSION: No fracture or dislocation is seen in left elbow. Electronically Signed   By: Elmer Picker M.D.   On: 09/14/2022 09:37    Procedures Procedures (including critical care time)  Medications Ordered in UC Medications - No data to display  Initial Impression / Assessment and Plan / UC Course  I have reviewed the triage vital signs and the nursing notes.  Pertinent labs & imaging results that were available during my care of the patient were reviewed by me and considered in my medical decision making (see chart for details).     MDM: 1.  Left elbow pain-left elbow x-ray revealed above. Advised patient may take OTC Ibuprofen 600 mg 1-2 times daily, as needed for left elbow pain.  Advised may RICE left elbow for 30 minutes 3 times daily for the next 3 days.  Advised if symptoms worsen and/or unresolved please follow-up with PCP or [facility] orthopedic provider for further evaluation.  Patient discharged home, hemodynamically stable. Final Clinical Impressions(s) / UC Diagnoses   Final diagnoses:  Left elbow pain     Discharge Instructions      Advised patient may take OTC Ibuprofen 600 mg 1-2 times daily, as needed for left elbow pain.  Advised may RICE left elbow for 30 minutes 3 times daily for the next 3 days.  Advised if symptoms worsen and/or unresolved please follow-up with PCP or [facility] orthopedic provider for further evaluation.     ED Prescriptions   None    PDMP not reviewed this encounter.   Eliezer Lofts, [place] 09/14/22 1022

## 2022-09-14 NOTE — Discharge Instructions (Addendum)
Advised patient may take OTC Ibuprofen 600 mg 1-2 times daily, as needed for left elbow pain.  Advised may RICE left elbow for 30 minutes 3 times daily for the next 3 days.  Advised if symptoms worsen and/or unresolved please follow-up with PCP or [facility] orthopedic provider for further evaluation.

## 2022-09-14 NOTE — ED Triage Notes (Signed)
Patient reports falling down about 4 stairs 1 week ago landing on her elbow. No previous injuries. No swelling. Taking ibuprofen otc

## 2022-12-14 ENCOUNTER — Ambulatory Visit
Admission: RE | Admit: 2022-12-14 | Discharge: 2022-12-14 | Disposition: A | Discharge status: Home / Self Care | Payer: 59 | Source: Ambulatory Visit | Attending: Urgent Care | Admitting: Urgent Care

## 2022-12-14 VITALS — BP 117/87 | HR 90 | Temp 98.2°F | Resp 16

## 2022-12-14 DIAGNOSIS — N765 Ulceration of vagina: Secondary | ICD-10-CM

## 2022-12-14 DIAGNOSIS — N72 Inflammatory disease of cervix uteri: Secondary | ICD-10-CM | POA: Diagnosis not present

## 2022-12-14 LAB — POCT URINALYSIS DIP (MANUAL ENTRY)
Bilirubin, UA: NEGATIVE
Glucose, UA: NEGATIVE mg/dL
Ketones, POC UA: NEGATIVE mg/dL
Leukocytes, UA: NEGATIVE
Nitrite, UA: NEGATIVE
Protein Ur, POC: >=300 mg/dL — AB
Spec Grav, UA: 1.025 (ref 1.010–1.025)
Urobilinogen, UA: 0.2 E.U./dL
pH, UA: 7 (ref 5.0–8.0)

## 2022-12-14 LAB — POCT URINE PREGNANCY: Preg Test, Ur: NEGATIVE

## 2022-12-14 MED ORDER — DOXYCYCLINE HYCLATE 100 MG PO CAPS: 100.0000 mg | ORAL_CAPSULE | Freq: Two times a day (BID) | ORAL | 0 refills | Status: AC

## 2022-12-14 MED ORDER — VALACYCLOVIR HCL 1 G PO TABS: 1000.0000 mg | ORAL_TABLET | Freq: Two times a day (BID) | ORAL | 0 refills | Status: AC

## 2022-12-14 MED ORDER — CEFTRIAXONE SODIUM 500 MG IJ SOLR
500.0000 mg | Freq: Once | INTRAMUSCULAR | Status: AC
  Administered 2022-12-14: 500 mg via INTRAMUSCULAR

## 2022-12-14 NOTE — ED Provider Notes (Signed)
[name]    CSN: [id number] Arrival date & time: 12/14/22  0853      History   Chief Complaint Chief Complaint  Patient presents with   Vaginal Discharge    Yellow discharge, nausea, fever ,cold chills, Bump on private area, fatigue - Entered by patient    HPI Ashley Romero is a 27 y.o. female.   Pleasant 27 year old female presents today due to concerns of a yellow malodorous vaginal discharge for the past 2 days.  She also reports discomfort with urination and two lesions in her vaginal region.  She does have a new partner.  She is not currently on birth control.  She denies pelvic pain but states she has been nauseated and vomited once.  Her last menstrual period was June 11 and 12.  She states it was only 2 days when her normal is 4. She has had hot/cold chills and felt feverish, but no temp.    Vaginal Discharge   Past Medical History:  Diagnosis Date   ADHD (attention deficit hyperactivity disorder)    Anemia    Anxiety    Constipation    Depression    GERD (gastroesophageal reflux disease)    OTC as needed   Gestational diabetes    Hypertension    Hypertrophy of tonsils and adenoids 08/2013   snores during sleep; had sleep study 03/2013:  AHI 1.9, RDI 2.3   Irregular menses    Nexplanon implant   Learning disorder involving mathematics    Migraines     Patient Active Problem List   Diagnosis Date Noted   History of severe pre-eclampsia 10/26/2016   History of gestational diabetes 10/26/2016   Eczema 06/18/2016   Morbid obesity with BMI of 50.0-59.9, adult (HCC) 04/21/2016   Preeclampsia, severe 01/16/2016   Influenza 09/06/2015   PCOS (polycystic ovarian syndrome) 06/10/2015   Gastroenteritis 04/30/2015   Chronic daily headache 03/22/2015   Migraine 03/22/2015   Sleep apnea 03/22/2015   Bipolar I disorder, most recent episode depressed (HCC) 01/13/2015   Depressive disorder 01/10/2015   Acanthosis nigricans 06/25/2014   Seborrheic  dermatitis 06/01/2014   Iron deficiency anemia 01/30/2014   Central auditory processing disorder 01/03/2014   School problem 08/25/2013   Acne vulgaris 05/02/2013   Fatigue 05/02/2013   Headache(784.0) 11/03/2012   BMI (body mass index), pediatric, > 99% for age 55/01/2013   GERD (gastroesophageal reflux disease) 11/03/2012   Allergic rhinitis 11/03/2012   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   Constipation    ADHD (attention deficit hyperactivity disorder) 06/26/2011   Mood disorder (HCC) 06/26/2011    Past Surgical History:  Procedure Laterality Date   CESAREAN SECTION N/A 01/18/2016   Procedure: CESAREAN SECTION;  Surgeon: Myna Hidalgo, DO;  [facility];  Service: Obstetrics;  Laterality: N/A;   TONSILLECTOMY AND ADENOIDECTOMY Bilateral 09/25/2013   Procedure: BILATERAL TONSILLECTOMY AND ADENOIDECTOMY;  Surgeon: Darletta Moll, MD;  [facility];  Service: ENT;  Laterality: Bilateral;   WISDOM TOOTH EXTRACTION  2015    OB History     Gravida  1   Para  1   Term  1   Preterm      AB      Living  1      SAB      IAB      Ectopic      Multiple  0   Live Births  1  Home Medications    Prior to Admission medications   Medication Sig Start Date End Date Taking? Authorizing Provider  doxycycline (VIBRAMYCIN) 100 MG capsule Take 1 capsule (100 mg total) by mouth 2 (two) times daily for 7 days. 12/14/22 12/21/22 Yes Hussain Maimone L, PA  valACYclovir (VALTREX) 1000 MG tablet Take 1 tablet (1,000 mg total) by mouth 2 (two) times daily for 7 days. 12/14/22 12/21/22 Yes Urban Naval L, PA    Family History Family History  Problem Relation Age of Onset   Asthma Mother    Hypertension Mother    Anesthesia problems Mother        hard to wake up post-op   Migraines Mother    Depression Mother    Anxiety disorder Mother    Heart disease Maternal Grandmother    Migraines Maternal Grandmother    Bipolar disorder Maternal  Aunt    Schizophrenia Maternal Aunt    Depression Maternal Aunt    ADD / ADHD Cousin        Many paternal 1 st cousins have ADD/ADHD    Social History Social History   Tobacco Use   Smoking status: Every Day    Packs/day: .1    Types: Cigarettes    Last attempt to quit: 10/07/2017    Years since quitting: 5.1   Smokeless tobacco: Never   Tobacco comments:    outside smokers at home  Vaping Use   Vaping Use: Never used  Substance Use Topics   Alcohol use: No    Alcohol/week: 0.0 standard drinks of alcohol   Drug use: Yes    Types: Marijuana    Comment: smokes marijuana daily      Allergies   Patient has no known allergies.   Review of Systems Review of Systems  Genitourinary:  Positive for vaginal discharge.  As per HPI   Physical Exam Triage Vital Signs ED Triage Vitals  Enc Vitals Group     BP 12/14/22 0908 117/87     Pulse Rate 12/14/22 0907 90     Resp 12/14/22 0906 16     Temp 12/14/22 0906 98.2 F (36.8 C)     Temp src --      SpO2 12/14/22 0906 97 %     Weight --      Height --      Head Circumference --      Peak Flow --      Pain Score 12/14/22 0905 0     Pain Loc --      Pain Edu? --      Excl. in GC? --    No data found.  Updated Vital Signs BP 117/87   Pulse 90   Temp 98.2 F (36.8 C)   Resp 16   LMP 12/07/2022 (Approximate)   SpO2 97%   Visual Acuity Right Eye Distance:   Left Eye Distance:   Bilateral Distance:    Right Eye Near:   Left Eye Near:    Bilateral Near:     Physical Exam Vitals and nursing note reviewed.  Constitutional:      General: She is not in acute distress.    Appearance: Normal appearance. She is well-developed. She is not ill-appearing, toxic-appearing or diaphoretic.  HENT:     Head: Normocephalic.  Cardiovascular:     Rate and Rhythm: Normal rate.     Heart sounds: No murmur heard. Pulmonary:     Effort: Pulmonary effort is normal. No respiratory distress.  Breath sounds: No wheezing.   Abdominal:     General: Abdomen is flat. Bowel sounds are normal. There is no distension. There are no signs of injury.     Palpations: Abdomen is soft. There is no shifting dullness, fluid wave, hepatomegaly, splenomegaly, mass or pulsatile mass.     Tenderness: There is no abdominal tenderness. There is no right CVA tenderness, left CVA tenderness, guarding or rebound. Negative signs include Murphy's sign, Rovsing's sign, McBurney's sign, psoas sign and obturator sign.     Hernia: No hernia is present.  Genitourinary:    General: Normal vulva.     Pubic Area: No rash or pubic lice.      Labia:        Right: Lesion present. No rash, tenderness or injury.        Left: Lesion present. No rash, tenderness or injury.      Urethra: No prolapse, urethral pain, urethral swelling or urethral lesion.     Vagina: No signs of injury and foreign body. Vaginal discharge present. No erythema, tenderness, bleeding, lesions or prolapsed vaginal walls.     Cervix: Discharge, friability, erythema and cervical bleeding present. No cervical motion tenderness, lesion or eversion.     Uterus: Normal. Not deviated, not enlarged, not fixed, not tender and no uterine prolapse.      Adnexa: Right adnexa normal and left adnexa normal.       Right: No mass, tenderness or fullness.         Left: No mass, tenderness or fullness.       Rectum: Normal.    Neurological:     Mental Status: She is alert.      UC Treatments / Results  Labs (all labs ordered are listed, but only abnormal results are displayed) Labs Reviewed  POCT URINALYSIS DIP (MANUAL ENTRY) - Abnormal; Notable for the following components:      Result Value   Color, UA light yellow (*)    Clarity, UA cloudy (*)    Blood, UA large (*)    Protein Ur, POC >=300 (*)    All other components within normal limits  HSV CULTURE AND TYPING  POCT URINE PREGNANCY  CERVICOVAGINAL ANCILLARY ONLY    EKG   Radiology No results  found.  Procedures Procedures (including critical care time)  Medications Ordered in UC Medications  cefTRIAXone (ROCEPHIN) injection 500 mg (500 mg Intramuscular Given 12/14/22 1020)    Initial Impression / Assessment and Plan / UC Course  I have reviewed the triage vital signs and the nursing notes.  Pertinent labs & imaging results that were available during my care of the patient were reviewed by me and considered in my medical decision making (see chart for details).     Cervicitis - sx consistent with cervicitis secondary to suspected STI. Pt given IM rocephin 500mg  in addition to BID doxycycline x 7 days. Instructed to avoid all forms of intercourse until results received and partners treated. Vaginal ulceration - x 2. 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 HSV culture obtained. Suspect this is the cause of the burning with urination as UA appears negative for infection. Valtrex initiated BID x 7 days. Pt to apply topical vaseline to affected areas of skin. Abnormal UA - high protein likely contamination from copious vaginal discharge.  Will send out culture for further assessment.   Final Clinical Impressions(s) / UC Diagnoses   Final diagnoses:  Cervicitis  Vaginal ulceration   Discharge Instructions   None  ED Prescriptions     Medication Sig Dispense Auth. Provider   doxycycline (VIBRAMYCIN) 100 MG capsule Take 1 capsule (100 mg total) by mouth 2 (two) times daily for 7 days. 14 capsule Zayonna Ayuso L, PA   valACYclovir (VALTREX) 1000 MG tablet Take 1 tablet (1,000 mg total) by mouth 2 (two) times daily for 7 days. 14 tablet Kimmie Doren L, [place]      PDMP not reviewed this encounter.   Maretta Bees, [place] 12/14/22 [phone]

## 2022-12-14 NOTE — ED Triage Notes (Addendum)
Pt presents to uc with co of vaginal discharge, yellow in color, pt also notes a vaginal lesion fever and nausea for 2 days. Pt reports mild dysuria and would like a pregnancy test as well. Had a 2 day small bleeding this month but not her normal period.

## 2022-12-14 NOTE — Discharge Instructions (Addendum)
Please read the attached handout on cervicitis. You are given an injection of an antibiotic here in our office today. You are tested for gonorrhea, chlamydia, trichomonas, bacterial vaginosis, yeast. You are also tested for herpes simplex virus.  Please avoid all forms of intercourse for the next 7 days.  If your test result is positive, your partner will also need treatment.  Please take the doxycycline twice daily for the next 7 days.  You must finish all of the antibiotics.  Please avoid excessive sun exposure while taking this antibiotic and drink plenty of water.  Please take Valtrex twice daily for the next 7 days.  Please apply topical Vaseline to the affected areas of your skin.

## 2022-12-15 LAB — URINE CULTURE: Culture: >=100000 — AB

## 2022-12-15 LAB — CERVICOVAGINAL ANCILLARY ONLY
Bacterial Vaginitis (gardnerella): POSITIVE — AB
Candida Glabrata: NEGATIVE
Candida Vaginitis: NEGATIVE
Chlamydia: NEGATIVE
Comment: NEGATIVE
Comment: NEGATIVE
Comment: NEGATIVE
Comment: NEGATIVE
Comment: NEGATIVE
Comment: NORMAL
Neisseria Gonorrhea: NEGATIVE
Trichomonas: NEGATIVE

## 2022-12-16 ENCOUNTER — Telehealth (HOSPITAL_COMMUNITY): Payer: Self-pay | Admitting: Emergency Medicine

## 2022-12-16 LAB — HSV CULTURE AND TYPING

## 2022-12-16 MED ORDER — METRONIDAZOLE 500 MG PO TABS: 500.0000 mg | ORAL_TABLET | Freq: Two times a day (BID) | ORAL | 0 refills | Status: DC

## 2022-12-18 DIAGNOSIS — A6000 Herpesviral infection of urogenital system, unspecified: Secondary | ICD-10-CM | POA: Insufficient documentation

## 2023-01-22 DIAGNOSIS — Z309 Encounter for contraceptive management, unspecified: Secondary | ICD-10-CM | POA: Insufficient documentation

## 2023-01-22 HISTORY — DX: Encounter for contraceptive management, unspecified: Z30.9

## 2023-01-30 ENCOUNTER — Ambulatory Visit
Admission: RE | Admit: 2023-01-30 | Discharge: 2023-01-30 | Disposition: A | Discharge status: Home / Self Care | Payer: 59 | Source: Ambulatory Visit | Attending: Family Medicine | Admitting: Family Medicine

## 2023-01-30 ENCOUNTER — Ambulatory Visit (INDEPENDENT_AMBULATORY_CARE_PROVIDER_SITE_OTHER): Payer: 59

## 2023-01-30 VITALS — BP 130/79 | HR 60 | Temp 98.4°F | Resp 16

## 2023-01-30 DIAGNOSIS — S90111A Contusion of right great toe without damage to nail, initial encounter: Secondary | ICD-10-CM

## 2023-01-30 DIAGNOSIS — S9781XA Crushing injury of right foot, initial encounter: Secondary | ICD-10-CM | POA: Diagnosis not present

## 2023-01-30 DIAGNOSIS — Z3490 Encounter for supervision of normal pregnancy, unspecified, unspecified trimester: Secondary | ICD-10-CM | POA: Diagnosis not present

## 2023-01-30 NOTE — ED Triage Notes (Signed)
Pt presents to uc with co of injury to right big toe and second toe. Pt reports she dropped a play statin on her foot last night.

## 2023-01-30 NOTE — Discharge Instructions (Signed)
NO BROKEN BONES Take tylenol for pain Use ice and elevation for pain Reduce walking while toe painful

## 2023-01-30 NOTE — ED Provider Notes (Signed)
[name]    CSN: [id number] Arrival date & time: 01/30/23  0831      History   Chief Complaint Chief Complaint  Patient presents with   Toe Injury    Entered by patient    HPI Ashley Romero is a 27 y.o. female.   HPI  Patient excellently dropped a PlayStation on her foot last night.  Is quite heavy.  She is injury to her first and second toe on the right foot. Patient is in early pregnancy  Past Medical History:  Diagnosis Date   ADHD (attention deficit hyperactivity disorder)    Anemia    Anxiety    Constipation    Depression    GERD (gastroesophageal reflux disease)    OTC as needed   Gestational diabetes    Hypertension    Hypertrophy of tonsils and adenoids 08/2013   snores during sleep; had sleep study 03/2013:  AHI 1.9, RDI 2.3   Irregular menses    Nexplanon implant   Learning disorder involving mathematics    Migraines     Patient Active Problem List   Diagnosis Date Noted   History of severe pre-eclampsia 10/26/2016   History of gestational diabetes 10/26/2016   Eczema 06/18/2016   Morbid obesity with BMI of 50.0-59.9, adult (HCC) 04/21/2016   Preeclampsia, severe 01/16/2016   Influenza 09/06/2015   PCOS (polycystic ovarian syndrome) 06/10/2015   Gastroenteritis 04/30/2015   Chronic daily headache 03/22/2015   Migraine 03/22/2015   Sleep apnea 03/22/2015   Bipolar I disorder, most recent episode depressed (HCC) 01/13/2015   Depressive disorder 01/10/2015   Acanthosis nigricans 06/25/2014   Seborrheic dermatitis 06/01/2014   Iron deficiency anemia 01/30/2014   Central auditory processing disorder 01/03/2014   School problem 08/25/2013   Acne vulgaris 05/02/2013   Fatigue 05/02/2013   Headache(784.0) 11/03/2012   BMI (body mass index), pediatric, > 99% for age 63/01/2013   GERD (gastroesophageal reflux disease) 11/03/2012   Allergic rhinitis 11/03/2012   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   Constipation    ADHD (attention  deficit hyperactivity disorder) 06/26/2011   Mood disorder (HCC) 06/26/2011    Past Surgical History:  Procedure Laterality Date   CESAREAN SECTION N/A 01/18/2016   Procedure: CESAREAN SECTION;  Surgeon: Myna Hidalgo, DO;  [facility];  Service: Obstetrics;  Laterality: N/A;   TONSILLECTOMY AND ADENOIDECTOMY Bilateral 09/25/2013   Procedure: BILATERAL TONSILLECTOMY AND ADENOIDECTOMY;  Surgeon: Darletta Moll, MD;  Location: [facility];  Service: ENT;  Laterality: Bilateral;   WISDOM TOOTH EXTRACTION  2015    OB History     Gravida  2   Para  1   Term  1   Preterm      AB      Living  1      SAB      IAB      Ectopic      Multiple  0   Live Births  1            Home Medications    Prior to Admission medications   Not on File    Family History Family History  Problem Relation Age of Onset   Asthma Mother    Hypertension Mother    Anesthesia problems Mother        hard to wake up post-op   Migraines Mother    Depression Mother    Anxiety disorder Mother    Heart disease Maternal Grandmother  Migraines Maternal Grandmother    Bipolar disorder Maternal Aunt    Schizophrenia Maternal Aunt    Depression Maternal Aunt    ADD / ADHD Cousin        Many paternal 1 st cousins have ADD/ADHD    Social History Social History   Tobacco Use   Smoking status: Every Day    Current packs/day: 0.00    Types: Cigarettes    Last attempt to quit: 10/07/2017    Years since quitting: 5.3   Smokeless tobacco: Never   Tobacco comments:    outside smokers at home  Vaping Use   Vaping status: Every Day   Substances: Nicotine, Flavoring  Substance Use Topics   Alcohol use: No    Alcohol/week: 0.0 standard drinks of alcohol   Drug use: Yes    Types: Marijuana    Comment: smokes marijuana daily      Allergies   Patient has no known allergies.   Review of Systems Review of Systems  See HPI Physical Exam Triage Vital  Signs ED Triage Vitals  Encounter Vitals Group     BP 01/30/23 0840 130/79     Systolic BP Percentile --      Diastolic BP Percentile --      Pulse Rate 01/30/23 0840 60     Resp 01/30/23 0840 16     Temp 01/30/23 0840 98.4 F (36.9 C)     Temp src --      SpO2 01/30/23 0840 98 %     Weight --      Height --      Head Circumference --      Peak Flow --      Pain Score 01/30/23 0839 8     Pain Loc --      Pain Education --      Exclude from Growth Chart --    No data found.  Updated Vital Signs BP 130/79   Pulse 60   Temp 98.4 F (36.9 C)   Resp 16   LMP 01/02/2023 (Exact Date)   SpO2 98%       Physical Exam Constitutional:      General: She is not in acute distress.    Appearance: She is well-developed.  HENT:     Head: Normocephalic and atraumatic.  Eyes:     Conjunctiva/sclera: Conjunctivae normal.     Pupils: Pupils are equal, round, and reactive to light.  Cardiovascular:     Rate and Rhythm: Normal rate.  Pulmonary:     Effort: Pulmonary effort is normal. No respiratory distress.  Abdominal:     General: There is no distension.     Palpations: Abdomen is soft.  Musculoskeletal:        General: Normal range of motion.     Cervical back: Normal range of motion.       Feet:  Feet:     Comments: Area of tenderness. Skin:    General: Skin is warm and dry.  Neurological:     Mental Status: She is alert.      UC Treatments / Results  Labs (all labs ordered are listed, but only abnormal results are displayed) Labs Reviewed - No data to display  EKG   Radiology DG Toe Great Right  Result Date: 01/30/2023 CLINICAL DATA:  Injury.  Dropped playstation on foot last evening EXAM: RIGHT GREAT TOE COMPARISON:  None Available. FINDINGS: There is no evidence of fracture or dislocation. There is no evidence  of arthropathy or other focal bone abnormality. Soft tissues are unremarkable. IMPRESSION: Negative. Electronically Signed   By: Signa Kell M.D.    On: 01/30/2023 09:18   DG Toe 2nd Right  Result Date: 01/30/2023 CLINICAL DATA:  Crush injury. EXAM: RIGHT SECOND TOE COMPARISON:  None Available. FINDINGS: There is no evidence of fracture or dislocation. There is no evidence of arthropathy or other focal bone abnormality. Soft tissues are unremarkable. IMPRESSION: Negative. Electronically Signed   By: Kennith Center M.D.   On: 01/30/2023 09:16    Procedures Procedures (including critical care time)  Medications Ordered in UC Medications - No data to display  Initial Impression / Assessment and Plan / UC Course  I have reviewed the triage vital signs and the nursing notes.  Pertinent labs & imaging results that were available during my care of the patient were reviewed by me and considered in my medical decision making (see chart for details).     Final Clinical Impressions(s) / UC Diagnoses   Final diagnoses:  Contusion of right great toe without damage to nail, initial encounter  Early stage of pregnancy     Discharge Instructions      NO BROKEN BONES Take tylenol for pain Use ice and elevation for pain Reduce walking while toe painful     ED Prescriptions   None    PDMP not reviewed this encounter.   Eustace Moore, MD 01/30/23 [phone]

## 2023-03-10 DIAGNOSIS — B009 Herpesviral infection, unspecified: Secondary | ICD-10-CM | POA: Insufficient documentation

## 2023-03-10 LAB — OB RESULTS CONSOLE HEPATITIS B SURFACE ANTIGEN: Hepatitis B Surface Ag: NEGATIVE

## 2023-03-10 LAB — OB RESULTS CONSOLE ABO/RH: RH Type: POSITIVE

## 2023-03-10 LAB — HEPATITIS C ANTIBODY: HCV Ab: NEGATIVE

## 2023-03-10 LAB — OB RESULTS CONSOLE RUBELLA ANTIBODY, IGM: Rubella: IMMUNE

## 2023-03-15 DIAGNOSIS — A568 Sexually transmitted chlamydial infection of other sites: Secondary | ICD-10-CM | POA: Insufficient documentation

## 2023-03-18 ENCOUNTER — Ambulatory Visit
Admission: RE | Admit: 2023-03-18 | Discharge: 2023-03-18 | Disposition: A | Discharge status: Home / Self Care | Payer: 59 | Source: Ambulatory Visit | Attending: Internal Medicine | Admitting: Internal Medicine

## 2023-03-18 VITALS — BP 113/76 | HR 90 | Temp 99.3°F | Resp 17

## 2023-03-18 DIAGNOSIS — O23591 Infection of other part of genital tract in pregnancy, first trimester: Secondary | ICD-10-CM | POA: Insufficient documentation

## 2023-03-18 DIAGNOSIS — R300 Dysuria: Secondary | ICD-10-CM | POA: Diagnosis present

## 2023-03-18 DIAGNOSIS — N760 Acute vaginitis: Secondary | ICD-10-CM | POA: Insufficient documentation

## 2023-03-18 DIAGNOSIS — R112 Nausea with vomiting, unspecified: Secondary | ICD-10-CM | POA: Diagnosis not present

## 2023-03-18 LAB — POCT URINALYSIS DIP (MANUAL ENTRY)
Bilirubin, UA: NEGATIVE
Blood, UA: NEGATIVE
Glucose, UA: NEGATIVE mg/dL
Ketones, POC UA: NEGATIVE mg/dL
Nitrite, UA: NEGATIVE
Spec Grav, UA: >=1.03 — AB (ref 1.010–1.025)
Urobilinogen, UA: 0.2 E.U./dL
pH, UA: 7 (ref 5.0–8.0)

## 2023-03-18 MED ORDER — ONDANSETRON 4 MG PO TBDP: 4.0000 mg | ORAL_TABLET | Freq: Three times a day (TID) | ORAL | 0 refills | Status: DC | PRN

## 2023-03-18 MED ORDER — DOXYLAMINE-PYRIDOXINE 10-10 MG PO TBEC: 1.0000 | DELAYED_RELEASE_TABLET | Freq: Two times a day (BID) | ORAL | 0 refills | Status: DC

## 2023-03-18 NOTE — ED Triage Notes (Signed)
Pt presents with c/o vaginal discomfort, fever and headaches X 2 days.   C/o vaginal d/c and soreness.   Denies n/v/d

## 2023-03-18 NOTE — ED Provider Notes (Addendum)
[facility]  Note:  This document was prepared using Conservation officer, historic buildings and may include unintentional dictation errors.  MRN: [id number] DOB: June 10, 1996  Subjective:   Ashley Romero is a 27 y.o. female currently 10 weeks 5 days pregnant presenting for 2-day history of recurrent vaginal discharge, vaginal soreness, dysuria, intermittent fevers and general headaches.  Denies fever, n/v, abdominal pain, pelvic pain, rashes, urinary frequency, hematuria.  No vaginal bleeding.  Patient has a history of BV, yeast infections, UTIs.  She has had a difficult time eating and drinking due to nausea and vomiting in pregnancy.  Has not taken any medications for this.  No current facility-administered medications for this encounter. No current outpatient medications on file.   No Known Allergies  Past Medical History:  Diagnosis Date   ADHD (attention deficit hyperactivity disorder)    Anemia    Anxiety    Constipation    Depression    GERD (gastroesophageal reflux disease)    OTC as needed   Gestational diabetes    Hypertension    Hypertrophy of tonsils and adenoids 08/2013   snores during sleep; had sleep study 03/2013:  AHI 1.9, RDI 2.3   Irregular menses    Nexplanon implant   Learning disorder involving mathematics    Migraines      Past Surgical History:  Procedure Laterality Date   CESAREAN SECTION N/A 01/18/2016   Procedure: CESAREAN SECTION;  Surgeon: Myna Hidalgo, DO;  [facility];  Service: Obstetrics;  Laterality: N/A;   TONSILLECTOMY AND ADENOIDECTOMY Bilateral 09/25/2013   Procedure: BILATERAL TONSILLECTOMY AND ADENOIDECTOMY;  Surgeon: Darletta Moll, MD;  Location: [facility];  Service: ENT;  Laterality: Bilateral;   WISDOM TOOTH EXTRACTION  2015    Family History  Problem Relation Age of Onset   Asthma Mother    Hypertension Mother    Anesthesia problems Mother        hard to wake up post-op    Migraines Mother    Depression Mother    Anxiety disorder Mother    Heart disease Maternal Grandmother    Migraines Maternal Grandmother    Bipolar disorder Maternal Aunt    Schizophrenia Maternal Aunt    Depression Maternal Aunt    ADD / ADHD Cousin        Many paternal 1 st cousins have ADD/ADHD    Social History   Tobacco Use   Smoking status: Every Day    Current packs/day: 0.00    Types: Cigarettes    Last attempt to quit: 10/07/2017    Years since quitting: 5.4   Smokeless tobacco: Never   Tobacco comments:    outside smokers at home  Vaping Use   Vaping status: Every Day   Substances: Nicotine, Flavoring  Substance Use Topics   Alcohol use: No    Alcohol/week: 0.0 standard drinks of alcohol   Drug use: Yes    Types: Marijuana    Comment: smokes marijuana daily     ROS   Objective:   Vitals: BP 113/76   Pulse 90   Temp 99.3 F (37.4 C) (Oral)   Resp 17   LMP 01/02/2023 (Exact Date)   SpO2 97%   Physical Exam Constitutional:      General: She is not in acute distress.    Appearance: Normal appearance. She is well-developed. She is not ill-appearing, toxic-appearing or diaphoretic.  HENT:     Head: Normocephalic and atraumatic.  Nose: Nose normal.     Mouth/Throat:     Mouth: Mucous membranes are moist.     Pharynx: Oropharynx is clear.  Eyes:     General: No scleral icterus.       Right eye: No discharge.        Left eye: No discharge.     Extraocular Movements: Extraocular movements intact.     Conjunctiva/sclera: Conjunctivae normal.  Cardiovascular:     Rate and Rhythm: Normal rate.  Pulmonary:     Effort: Pulmonary effort is normal.  Abdominal:     General: Bowel sounds are normal. There is no distension.     Palpations: Abdomen is soft. There is no mass.     Tenderness: There is no abdominal tenderness. There is no right CVA tenderness, left CVA tenderness, guarding or rebound.  Skin:    General: Skin is warm and dry.   Neurological:     General: No focal deficit present.     Mental Status: She is alert and oriented to person, place, and time.  Psychiatric:        Mood and Affect: Mood normal.        Behavior: Behavior normal.        Thought Content: Thought content normal.        Judgment: Judgment normal.     Results for orders placed or performed during the hospital encounter of 03/18/23 (from the past 24 hour(s))  POCT urinalysis dipstick     Status: Abnormal   Collection Time: 03/18/23  2:57 PM  Result Value Ref Range   Color, UA yellow yellow   Clarity, UA clear clear   Glucose, UA negative negative mg/dL   Bilirubin, UA negative negative   Ketones, POC UA negative negative mg/dL   Spec Grav, UA >=0.865 (A) 1.010 - 1.025   Blood, UA negative negative   pH, UA 7.0 5.0 - 8.0   Protein Ur, POC trace (A) negative mg/dL   Urobilinogen, UA 0.2 0.2 or 1.0 E.U./dL   Nitrite, UA Negative Negative   Leukocytes, UA Trace (A) Negative    Assessment and Plan :   PDMP not reviewed this encounter.  1. Vaginitis during pregnancy in first trimester   2. Dysuria   3. Nausea and vomiting, unspecified vomiting type    I recommended patient go ahead and try Diclegis for her nausea and vomiting in pregnancy.  If this does not provide relief to her, recommend trying Zofran starting next week.  Follow-up with OB/GYN.  Will hold off on empiric treatment given her pregnancy.  Recommend more consistent hydration and supportive care for now.  Will base treatment off of results.  Counseled patient on potential for adverse effects with medications prescribed/recommended today, ER and return-to-clinic precautions discussed, patient verbalized understanding.     Wallis Bamberg, [place] 03/18/23 1507

## 2023-03-18 NOTE — Discharge Instructions (Signed)
Your urinalysis from today did not show urinary tract infection.  I do recommend hydrating much better with at least 80 ounces of water daily as the urinalysis did show dehydration.  I am ordering a urine culture to see if and urinary tract infection does show up.  We should have that result in 3 to 4 days.  The vaginal swab results should be available tomorrow.  Will reach out to you and get you treated based off of any positive results.

## 2023-03-19 ENCOUNTER — Telehealth: Payer: Self-pay

## 2023-03-19 ENCOUNTER — Encounter (HOSPITAL_COMMUNITY): Payer: Self-pay | Admitting: Obstetrics & Gynecology

## 2023-03-19 ENCOUNTER — Inpatient Hospital Stay (HOSPITAL_COMMUNITY)
Admission: AD | Admit: 2023-03-19 | Discharge: 2023-03-19 | Discharge status: Against Medical Advice | Payer: 59 | Attending: Obstetrics & Gynecology | Admitting: Obstetrics & Gynecology

## 2023-03-19 DIAGNOSIS — Z1152 Encounter for screening for COVID-19: Secondary | ICD-10-CM | POA: Diagnosis not present

## 2023-03-19 DIAGNOSIS — Z5321 Procedure and treatment not carried out due to patient leaving prior to being seen by health care provider: Secondary | ICD-10-CM | POA: Insufficient documentation

## 2023-03-19 LAB — URINALYSIS, ROUTINE W REFLEX MICROSCOPIC
Bilirubin Urine: NEGATIVE
Glucose, UA: NEGATIVE mg/dL
Ketones, ur: 20 mg/dL — AB
Nitrite: NEGATIVE
Protein, ur: NEGATIVE mg/dL
Specific Gravity, Urine: 1.017 (ref 1.005–1.030)
pH: 5 (ref 5.0–8.0)

## 2023-03-19 LAB — CERVICOVAGINAL ANCILLARY ONLY
Bacterial Vaginitis (gardnerella): NEGATIVE
Candida Glabrata: NEGATIVE
Candida Vaginitis: POSITIVE — AB
Chlamydia: POSITIVE — AB
Comment: NEGATIVE
Comment: NEGATIVE
Comment: NEGATIVE
Comment: NEGATIVE
Comment: NEGATIVE
Comment: NORMAL
Neisseria Gonorrhea: NEGATIVE
Trichomonas: NEGATIVE

## 2023-03-19 LAB — RESP PANEL BY RT-PCR (RSV, FLU A&B, COVID)  RVPGX2
Influenza A by PCR: NEGATIVE
Influenza B by PCR: NEGATIVE
Resp Syncytial Virus by PCR: NEGATIVE
SARS Coronavirus 2 by RT PCR: NEGATIVE

## 2023-03-19 MED ORDER — ONDANSETRON 4 MG PO TBDP: 8.0000 mg | ORAL_TABLET | Freq: Once | ORAL | Status: DC

## 2023-03-19 NOTE — MAU Note (Signed)
.  Ashley Romero is a 26 y.o. at [redacted]w[redacted]d here in MAU reporting: feeling feverish(does not have thermometer) , headache, back pain and N/V x 3 days. Also stated she has tested positive for chlamydia (has not picked up Rx for it urgent care called one in for her). Took tylenol for headache yesterday and helped a little bit.   LMP:  Onset of complaint: 3 days.  Pain score: 5 Vitals:   03/19/23 1614  BP: 123/73  Pulse: 78  Resp: 18  Temp: 98.5 F (36.9 C)     FHT:164 Lab orders placed from triage:  u/a Covid, Flu

## 2023-03-19 NOTE — Telephone Encounter (Signed)
Pt called to come in and give another urine sample. Pt seen here yesterday,lab called and said they got the wrong patients specimen with her requisition. Pt states she will come back in.

## 2023-03-19 NOTE — MAU Note (Signed)
  Pt stated she was hungry and did not want to wait anymore. Instructed that t she could come back at anytime if she wanted to be evaluated.

## 2023-03-19 NOTE — MAU Provider Note (Signed)
Patient not in room. Left after being seen in triage. Patient was notified of Neg COVID results by Roney Mans, RN. Not seen by provider.  Raelyn Mora, CNM  03/19/2023 7:28 PM

## 2023-03-20 LAB — CULTURE, OB URINE: Culture: NO GROWTH

## 2023-03-22 ENCOUNTER — Telehealth: Payer: Self-pay

## 2023-03-22 MED ORDER — AZITHROMYCIN 250 MG PO TABS: 250.0000 mg | ORAL_TABLET | Freq: Once | ORAL | 0 refills | Status: AC

## 2023-03-22 MED ORDER — CLOTRIMAZOLE-BETAMETHASONE 1-0.05 % EX CREA: TOPICAL_CREAM | Freq: Every evening | CUTANEOUS | 0 refills | Status: AC

## 2023-03-29 ENCOUNTER — Inpatient Hospital Stay (HOSPITAL_COMMUNITY)
Admission: AD | Admit: 2023-03-29 | Discharge: 2023-03-30 | Disposition: A | Discharge status: Home / Self Care | Payer: 59 | Attending: Obstetrics & Gynecology | Admitting: Obstetrics & Gynecology

## 2023-03-29 DIAGNOSIS — R1030 Lower abdominal pain, unspecified: Secondary | ICD-10-CM | POA: Insufficient documentation

## 2023-03-29 DIAGNOSIS — N898 Other specified noninflammatory disorders of vagina: Secondary | ICD-10-CM | POA: Diagnosis not present

## 2023-03-29 DIAGNOSIS — Z3A13 13 weeks gestation of pregnancy: Secondary | ICD-10-CM | POA: Insufficient documentation

## 2023-03-29 DIAGNOSIS — Z87891 Personal history of nicotine dependence: Secondary | ICD-10-CM | POA: Insufficient documentation

## 2023-03-29 DIAGNOSIS — O26891 Other specified pregnancy related conditions, first trimester: Secondary | ICD-10-CM | POA: Insufficient documentation

## 2023-03-29 DIAGNOSIS — R109 Unspecified abdominal pain: Secondary | ICD-10-CM

## 2023-03-29 DIAGNOSIS — F3130 Bipolar disorder, current episode depressed, mild or moderate severity, unspecified: Secondary | ICD-10-CM

## 2023-03-29 LAB — URINALYSIS, ROUTINE W REFLEX MICROSCOPIC
Bilirubin Urine: NEGATIVE
Glucose, UA: NEGATIVE mg/dL
Ketones, ur: 5 mg/dL — AB
Leukocytes,Ua: NEGATIVE
Nitrite: NEGATIVE
Protein, ur: NEGATIVE mg/dL
RBC / HPF: >50 RBC/hpf (ref 0–5)
Specific Gravity, Urine: 1.024 (ref 1.005–1.030)
pH: 5 (ref 5.0–8.0)

## 2023-03-29 LAB — WET PREP, GENITAL
Clue Cells Wet Prep HPF POC: NONE SEEN
Sperm: NONE SEEN
Trich, Wet Prep: NONE SEEN
WBC, Wet Prep HPF POC: <10 (ref <10)
Yeast Wet Prep HPF POC: NONE SEEN

## 2023-03-29 NOTE — MAU Note (Addendum)
Pt says she feels lower abd cramps and right lower - started today at 630. 3/10.  No meds Had a gush of fluid came out  at 630pm- got underwear wet.  None since  Last sex- today at [facility]  ( She was positive 1 -2 weeks ago for cx - she and partner were treated  )

## 2023-03-30 ENCOUNTER — Inpatient Hospital Stay (HOSPITAL_COMMUNITY): Payer: 59

## 2023-03-30 DIAGNOSIS — O26891 Other specified pregnancy related conditions, first trimester: Secondary | ICD-10-CM | POA: Diagnosis not present

## 2023-03-30 LAB — COMPREHENSIVE METABOLIC PANEL
ALT: 10 U/L (ref 0–44)
AST: 10 U/L — ABNORMAL LOW (ref 15–41)
Albumin: 3.2 g/dL — ABNORMAL LOW (ref 3.5–5.0)
Alkaline Phosphatase: 36 U/L — ABNORMAL LOW (ref 38–126)
Anion gap: 12 (ref 5–15)
BUN: 8 mg/dL (ref 6–20)
CO2: 19 mmol/L — ABNORMAL LOW (ref 22–32)
Calcium: 8.9 mg/dL (ref 8.9–10.3)
Chloride: 103 mmol/L (ref 98–111)
Creatinine, Ser: 0.59 mg/dL (ref 0.44–1.00)
GFR, Estimated: >60 mL/min (ref >60)
Glucose, Bld: 90 mg/dL (ref 70–99)
Potassium: 3.6 mmol/L (ref 3.5–5.1)
Sodium: 134 mmol/L — ABNORMAL LOW (ref 135–145)
Total Bilirubin: 0.6 mg/dL (ref 0.3–1.2)
Total Protein: 6.7 g/dL (ref 6.5–8.1)

## 2023-03-30 LAB — CBC WITH DIFFERENTIAL/PLATELET
Abs Immature Granulocytes: 0.04 10*3/uL (ref 0.00–0.07)
Basophils Absolute: 0 10*3/uL (ref 0.0–0.1)
Basophils Relative: 0 %
Eosinophils Absolute: 0.1 10*3/uL (ref 0.0–0.5)
Eosinophils Relative: 1 %
HCT: 35.3 % — ABNORMAL LOW (ref 36.0–46.0)
Hemoglobin: 12 g/dL (ref 12.0–15.0)
Immature Granulocytes: 0 %
Lymphocytes Relative: 19 %
Lymphs Abs: 2 10*3/uL (ref 0.7–4.0)
MCH: 29.2 pg (ref 26.0–34.0)
MCHC: 34 g/dL (ref 30.0–36.0)
MCV: 85.9 fL (ref 80.0–100.0)
Monocytes Absolute: 0.6 10*3/uL (ref 0.1–1.0)
Monocytes Relative: 6 %
Neutro Abs: 7.6 10*3/uL (ref 1.7–7.7)
Neutrophils Relative %: 74 %
Platelets: 207 10*3/uL (ref 150–400)
RBC: 4.11 MIL/uL (ref 3.87–5.11)
RDW: 13.8 % (ref 11.5–15.5)
WBC: 10.4 10*3/uL (ref 4.0–10.5)
nRBC: 0 % (ref 0.0–0.2)

## 2023-03-30 LAB — GC/CHLAMYDIA PROBE AMP (~~LOC~~) NOT AT ARMC
Chlamydia: POSITIVE — AB
Comment: NEGATIVE
Comment: NORMAL
Neisseria Gonorrhea: NEGATIVE

## 2023-03-30 MED ORDER — IBUPROFEN 600 MG PO TABS
600.0000 mg | ORAL_TABLET | Freq: Once | ORAL | Status: AC
  Administered 2023-03-30: 600 mg via ORAL
  Filled 2023-03-30: qty 1

## 2023-03-30 NOTE — MAU Provider Note (Signed)
History     CSN: [id number]  Arrival date and time: 03/29/23 1922  Provider's first contact with patient was at [facility]    Chief Complaint  Patient presents with   Abdominal Pain   HPI [name] is a 27 y.o. year old G31P1001 female at [redacted]w[redacted]d weeks gestation who presents to MAU reporting right lower abdominal cramping since 6:30 PM she rates it a 3 out of 10.  She also reports that a gush of fluid came out of her vagina and got her underwear wet around the same time.  She does report unprotected sexual intercourse at noon today.  She has a history of positive chlamydia in this pregnancy about 1 to 2 weeks ago where she reports she and her partner were both treated.  She has not taken any medications for this lower abdominal cramping today.  She plans to start prenatal care with [facility]; her next appointment is April 08, 2023.  OB History     Gravida  2   Para  1   Term  1   Preterm      AB      Living  1      SAB      IAB      Ectopic      Multiple  0   Live Births  1           Past Medical History:  Diagnosis Date   ADHD (attention deficit hyperactivity disorder)    Anemia    Anxiety    Constipation    Depression    GERD (gastroesophageal reflux disease)    OTC as needed   Gestational diabetes    Hypertension    Hypertrophy of tonsils and adenoids 08/2013   snores during sleep; had sleep study 03/2013:  AHI 1.9, RDI 2.3   Irregular menses    Nexplanon implant   Learning disorder involving mathematics    Migraines     Past Surgical History:  Procedure Laterality Date   CESAREAN SECTION N/A 01/18/2016   Procedure: CESAREAN SECTION;  Surgeon: Myna Hidalgo, DO;  [facility]: [facility];  Service: Obstetrics;  Laterality: N/A;   TONSILLECTOMY AND ADENOIDECTOMY Bilateral 09/25/2013   Procedure: BILATERAL TONSILLECTOMY AND ADENOIDECTOMY;  Surgeon: Darletta Moll, MD;  Location: [facility];  Service: ENT;   Laterality: Bilateral;   WISDOM TOOTH EXTRACTION  2015    Family History  Problem Relation Age of Onset   Asthma Mother    Hypertension Mother    Anesthesia problems Mother        hard to wake up post-op   Migraines Mother    Depression Mother    Anxiety disorder Mother    Heart disease Maternal Grandmother    Migraines Maternal Grandmother    Bipolar disorder Maternal Aunt    Schizophrenia Maternal Aunt    Depression Maternal Aunt    ADD / ADHD Cousin        Many paternal 1 st cousins have ADD/ADHD    Social History   Tobacco Use   Smoking status: Former    Types: Cigars    Quit date: 02/28/2023    Years since quitting: 0.0   Smokeless tobacco: Never   Tobacco comments:    outside smokers at home  Vaping Use   Vaping status: Never Used  Substance Use Topics   Alcohol use: No    Alcohol/week: 0.0 standard drinks of alcohol   Drug use:  Not Currently    Types: Marijuana    Comment: smokes marijuana daily     Allergies: No Known Allergies  Medications Prior to Admission  Medication Sig Dispense Refill Last Dose   [EXPIRED] clotrimazole-betamethasone (LOTRISONE) cream Apply topically at bedtime for 7 days. Clotrimazole vaginal cream 0.2%, 1 applicator to vagina at night x 7 days. 15 g 0    Doxylamine-Pyridoxine (DICLEGIS) 10-10 MG TBEC Take 1 tablet by mouth in the morning and at bedtime. 60 tablet 0    ondansetron (ZOFRAN-ODT) 4 MG disintegrating tablet Take 1 tablet (4 mg total) by mouth every 8 (eight) hours as needed for nausea or vomiting. 20 tablet 0     Review of Systems  Constitutional: Negative.   HENT: Negative.    Eyes: Negative.   Respiratory: Negative.    Cardiovascular: Negative.   Gastrointestinal: Negative.   Endocrine: Negative.   Genitourinary:  Positive for pelvic pain and vaginal discharge.  Musculoskeletal: Negative.   Skin: Negative.   Allergic/Immunologic: Negative.   Neurological: Negative.   Hematological: Negative.    Psychiatric/Behavioral: Negative.     Physical Exam   Blood pressure 111/72, pulse 80, temperature 98.6 F (37 C), temperature source Oral, resp. rate 16, height 5' (1.524 m), weight 73.3 kg, last menstrual period 01/02/2023.  Physical Exam Constitutional:      Appearance: Normal appearance. She is obese.  Cardiovascular:     Rate and Rhythm: Normal rate.  Pulmonary:     Effort: Pulmonary effort is normal.  Abdominal:     Palpations: Abdomen is soft.     Tenderness: There is abdominal tenderness (lower RT side).  Genitourinary:    Comments: Swabs collected by patient using blind swab technique  Musculoskeletal:        General: Normal range of motion.  Skin:    General: Skin is warm and dry.  Neurological:     Mental Status: She is alert and oriented to person, place, and time.  Psychiatric:        Mood and Affect: Mood normal.        Behavior: Behavior normal.        Thought Content: Thought content normal.        Judgment: Judgment normal.   FHTs by doppler: 162 bpm  MAU Course  Procedures  MDM CCUA UCx -- Results pending Wet Prep GC/CT -- Results pending CBC w/Diff CMP OB<14 wks TVUS Ibuprofen 600 mg po -- resolved pain  Results for orders placed or performed during the hospital encounter of 03/29/23 (from the past 24 hour(s))  Urinalysis, Routine w reflex microscopic -Urine, Clean Catch     Status: Abnormal   Collection Time: 03/29/23  7:58 PM  Result Value Ref Range   Color, Urine YELLOW YELLOW   APPearance HAZY (A) CLEAR   Specific Gravity, Urine 1.024 1.005 - 1.030   pH 5.0 5.0 - 8.0   Glucose, UA NEGATIVE NEGATIVE mg/dL   Hgb urine dipstick MODERATE (A) NEGATIVE   Bilirubin Urine NEGATIVE NEGATIVE   Ketones, ur 5 (A) NEGATIVE mg/dL   Protein, ur NEGATIVE NEGATIVE mg/dL   Nitrite NEGATIVE NEGATIVE   Leukocytes,Ua NEGATIVE NEGATIVE   RBC / HPF >50 0 - 5 RBC/hpf   WBC, UA 0-5 0 - 5 WBC/hpf   Bacteria, UA RARE (A) NONE SEEN   Squamous Epithelial /  HPF 0-5 0 - 5 /HPF   Mucus PRESENT    Ca Oxalate Crys, UA PRESENT   Wet prep, genital  Status: None   Collection Time: 03/29/23 10:30 PM  Result Value Ref Range   Yeast Wet Prep HPF POC NONE SEEN NONE SEEN   Trich, Wet Prep NONE SEEN NONE SEEN   Clue Cells Wet Prep HPF POC NONE SEEN NONE SEEN   WBC, Wet Prep HPF POC <10 <10   Sperm NONE SEEN     Assessment and Plan  1. Abdominal pain during pregnancy in second trimester - Discharge patient  2. Vaginal discharge - Reassurance given that the fluid she leaked earlier was from SI at noon; advised this is normal  3. [redacted] weeks gestation of pregnancy   - Discharge home - Keep scheduled appointment with [facility] on 04/08/2023 - Patient verbalized an understanding of the plan of care and agrees.   Raelyn Mora, CNM 03/30/2023, 12:35 AM

## 2023-03-30 NOTE — Progress Notes (Signed)
Chlamydia pos, has known infx and has been treated so likely just needs repeat 3-4 weeks out from treatment. Will send MyChart message with info and rec for retesting.

## 2023-03-31 LAB — CULTURE, OB URINE: Culture: 80000 — AB

## 2023-04-02 ENCOUNTER — Encounter: Payer: Self-pay | Admitting: Obstetrics and Gynecology

## 2023-04-02 DIAGNOSIS — R8271 Bacteriuria: Secondary | ICD-10-CM | POA: Insufficient documentation

## 2023-05-02 ENCOUNTER — Ambulatory Visit: Payer: 59

## 2023-05-03 ENCOUNTER — Inpatient Hospital Stay (HOSPITAL_COMMUNITY)
Admission: AD | Admit: 2023-05-03 | Discharge: 2023-05-03 | Disposition: A | Discharge status: Home / Self Care | Payer: 59 | Attending: Obstetrics & Gynecology | Admitting: Obstetrics & Gynecology

## 2023-05-03 ENCOUNTER — Ambulatory Visit
Admission: RE | Admit: 2023-05-03 | Discharge: 2023-05-03 | Disposition: A | Discharge status: Home / Self Care | Payer: 59 | Source: Ambulatory Visit

## 2023-05-03 VITALS — BP 96/66 | HR 81 | Temp 98.7°F | Resp 18 | Ht 60.0 in | Wt 165.0 lb

## 2023-05-03 DIAGNOSIS — O99830 Other infection carrier state complicating pregnancy: Secondary | ICD-10-CM | POA: Insufficient documentation

## 2023-05-03 DIAGNOSIS — G43909 Migraine, unspecified, not intractable, without status migrainosus: Secondary | ICD-10-CM | POA: Insufficient documentation

## 2023-05-03 DIAGNOSIS — H1089 Other conjunctivitis: Secondary | ICD-10-CM | POA: Insufficient documentation

## 2023-05-03 DIAGNOSIS — O99352 Diseases of the nervous system complicating pregnancy, second trimester: Secondary | ICD-10-CM | POA: Insufficient documentation

## 2023-05-03 DIAGNOSIS — H109 Unspecified conjunctivitis: Secondary | ICD-10-CM | POA: Diagnosis not present

## 2023-05-03 DIAGNOSIS — Z3A17 17 weeks gestation of pregnancy: Secondary | ICD-10-CM | POA: Insufficient documentation

## 2023-05-03 DIAGNOSIS — R634 Abnormal weight loss: Secondary | ICD-10-CM | POA: Insufficient documentation

## 2023-05-03 DIAGNOSIS — Z98891 History of uterine scar from previous surgery: Secondary | ICD-10-CM | POA: Insufficient documentation

## 2023-05-03 DIAGNOSIS — H539 Unspecified visual disturbance: Secondary | ICD-10-CM

## 2023-05-03 LAB — PROTEIN / CREATININE RATIO, URINE
Creatinine, Urine: 131 mg/dL
Protein Creatinine Ratio: 0.09 mg/mg{creat} (ref 0.00–0.15)
Total Protein, Urine: 12 mg/dL

## 2023-05-03 MED ORDER — POLYMYXIN B-TRIMETHOPRIM 10000-0.1 UNIT/ML-% OP SOLN
1.0000 [drp] | OPHTHALMIC | 0 refills | Status: AC
Start: 2023-05-03 — End: 2023-05-10

## 2023-05-03 MED ORDER — ACETAMINOPHEN-CAFFEINE 500-65 MG PO TABS
2.0000 | ORAL_TABLET | Freq: Once | ORAL | Status: AC
  Administered 2023-05-03: 2 via ORAL
  Filled 2023-05-03: qty 2

## 2023-05-03 NOTE — MAU Provider Note (Signed)
History     CSN: [id number]  Arrival date and time: 05/03/23 1311   Event Date/Time   First Provider Initiated Contact with Patient 05/03/23 1505      Chief Complaint  Patient presents with   Blurred Vision   Hypertension   HPI Ashley Romero is a 27 y.o. at [redacted]w[redacted]d who presents to MAU from urgent care due to vision changes. She recalls itching her eye two days ago and thought she may have scratched or irritated her eye. Since then, she has noticed more blurred vision out of her right eye. Has woken up with right eye crusted shut. She reports left sided vision has been normal. She also reports her vision improves when she wears her glasses. Also endorses slight headache, feels similar to her chronic migraines. Has not tried any meds for her headache. Has nausea but unchanged from baseline and declines intervention at this time. Reports mood has been ok, no changes recently. Denies CP, palp, RUQ pain, lower extremity edema. Has hx of pre-e, does not feel current to prior presentation.  Past Medical History:  Diagnosis Date   ADHD (attention deficit hyperactivity disorder)    Anemia    Anxiety    Constipation    Depression    GERD (gastroesophageal reflux disease)    OTC as needed   Gestational diabetes    Hypertension    Hypertrophy of tonsils and adenoids 08/2013   snores during sleep; had sleep study 03/2013:  AHI 1.9, RDI 2.3   Irregular menses    Nexplanon implant   Learning disorder involving mathematics    Migraines     Past Surgical History:  Procedure Laterality Date   CESAREAN SECTION N/A 01/18/2016   Procedure: CESAREAN SECTION;  Surgeon: Myna Hidalgo, DO;  Location: [facility];  Service: Obstetrics;  Laterality: N/A;   TONSILLECTOMY AND ADENOIDECTOMY Bilateral 09/25/2013   Procedure: BILATERAL TONSILLECTOMY AND ADENOIDECTOMY;  Surgeon: Darletta Moll, MD;  Location:  [facility];  Service: ENT;  Laterality: Bilateral;   WISDOM TOOTH EXTRACTION   2015    Family History  Problem Relation Age of Onset   Asthma Mother    Hypertension Mother    Anesthesia problems Mother        hard to wake up post-op   Migraines Mother    Depression Mother    Anxiety disorder Mother    Heart disease Maternal Grandmother    Migraines Maternal Grandmother    Bipolar disorder Maternal Aunt    Schizophrenia Maternal Aunt    Depression Maternal Aunt    ADD / ADHD Cousin        Many paternal 1 st cousins have ADD/ADHD    Social History   Tobacco Use   Smoking status: Former    Types: Cigars    Quit date: 02/28/2023    Years since quitting: 0.1   Smokeless tobacco: Never   Tobacco comments:    outside smokers at home  Vaping Use   Vaping status: Never Used  Substance Use Topics   Alcohol use: No    Alcohol/week: 0.0 standard drinks of alcohol   Drug use: Not Currently    Types: Marijuana    Comment: smokes marijuana daily     Allergies: No Known Allergies  Medications Prior to Admission  Medication Sig Dispense Refill Last Dose   azithromycin (ZITHROMAX) 1 g powder Take 1 g by mouth once.      azithromycin (ZITHROMAX) 250 MG tablet Take 250 mg  by mouth once.      azithromycin (ZITHROMAX) 500 MG tablet Take 500 mg by mouth as directed.      BLISOVI FE 1/20 1-20 MG-MCG tablet Take 1 tablet by mouth daily.      Cetirizine HCl (ZYRTEC ALLERGY) 10 MG CAPS Take 10 mg by mouth daily.      Cholecalciferol (VITAMIN D3) 50 MCG (2000 UT) TABS Take 4,000 Units by mouth daily.      clotrimazole (GYNE-LOTRIMIN) 1 % vaginal cream Place 1 Applicatorful vaginally at bedtime.      Doxylamine-Pyridoxine (DICLEGIS) 10-10 MG TBEC Take 1 tablet by mouth in the morning and at bedtime. 60 tablet 0    hydrOXYzine (ATARAX) 10 MG tablet Take 10-20 mg by mouth at bedtime.      metroNIDAZOLE (METROGEL) 0.75 % vaginal gel Place 1 Applicatorful vaginally at bedtime.      NIFEdipine (ADALAT CC) 60 MG 24 hr tablet Take 60 mg by mouth daily.      ondansetron  (ZOFRAN-ODT) 4 MG disintegrating tablet Take 1 tablet (4 mg total) by mouth every 8 (eight) hours as needed for nausea or vomiting. 20 tablet 0    Prenatal Vit-Fe Fumarate-FA (PRENATAL VITAMIN) 27-0.8 MG TABS Take 1 tablet by mouth daily.      sertraline (ZOLOFT) 25 MG tablet Take 25 mg by mouth daily.      valACYclovir (VALTREX) 1000 MG tablet Take 1,000 mg by mouth daily.      ROS reviewed and pertinent positives and negatives as documented in HPI.  Physical Exam   Blood pressure 118/66, pulse 74, temperature 98.1 F (36.7 C), resp. rate 18, height 5' (1.524 m), weight 79.8 kg, last menstrual period 01/02/2023.  Physical Exam Constitutional:      General: She is not in acute distress.    Appearance: Normal appearance. She is not ill-appearing.  HENT:     Head: Normocephalic and atraumatic.  Eyes:     General: Lids are normal. No visual field deficit.       Right eye: No foreign body or discharge.        Left eye: No foreign body or discharge.     Extraocular Movements:     Right eye: Normal extraocular motion.     Left eye: Normal extraocular motion.     Conjunctiva/sclera:     Right eye: Right conjunctiva is injected.     Left eye: Left conjunctiva is not injected.  Cardiovascular:     Rate and Rhythm: Normal rate.  Pulmonary:     Effort: Pulmonary effort is normal.     Breath sounds: Normal breath sounds.  Abdominal:     Palpations: Abdomen is soft.     Tenderness: There is no abdominal tenderness. There is no guarding.  Musculoskeletal:        General: Normal range of motion.  Skin:    General: Skin is warm and dry.     Findings: No rash.  Neurological:     General: No focal deficit present.     Mental Status: She is alert and oriented to person, place, and time.     Cranial Nerves: No cranial nerve deficit.     Sensory: No sensory deficit.     Motor: No weakness.     Coordination: Coordination normal.     Gait: Gait normal.     MAU Course   Procedures  MDM 27 y.o. G2P1001 at [redacted]w[redacted]d who presents with blurred vision in right eye after injury to  right eye irritation/injury. Since then, having discharge and blurred vision. On exam, conjunctiva injected, but unable to assess for corneal abrasion, though low suspicion based on hx and very mild sxs. Also w headache, though known headache history. BP is normotensive on presentation here and was low in urgent care. Based on above and GA, low suspicion for early pre-e; hx and physical not concerning for other etiologies including CVA/TIA. Suspect migraine, which improved w Excedrin. Baseline PCR obtained. Given info for excedrin, and will Rx Polytrim for conjunctivitis, given hx unilateral eye crusting. Return precautions addressed.  Assessment and Plan  Bacterial conjunctivitis of right eye Rx for Polytrim sent  Migraine without status migrainosus, not intractable, unspecified migraine type Better after Excedrin  Sundra Aland 05/03/2023, 4:15 PM

## 2023-05-03 NOTE — ED Notes (Signed)
Patient is being discharged from the Urgent Care and sent to the Emergency Department via POV . Per Billee Cashing PA, patient is in need of higher level of care due to visual changes. Patient is aware and verbalizes understanding of plan of care.  Vitals:   05/03/23 1202  BP: 96/66  Pulse: 81  Resp: 18  Temp: 98.7 F (37.1 C)  SpO2: 97%

## 2023-05-03 NOTE — MAU Note (Addendum)
.  Ashley Romero is a 27 y.o. at [redacted]w[redacted]d here in MAU reporting: she went to urgent care because her vision in her right eye has been blurry x 2 days She doe reports she poked her  eye 2 days ago and it has been blurry since.  They told her her b/p was a little elevated (pt did not remember the b/p numbers) a sent her here. ( Chart shows 96/66).  Also reports she has been having headaches off and on for a few weeks. Reports a slight headache today. LMP:  Onset of complaint: 2 days Pain score: 3 Vitals:   05/03/23 1443  BP: 118/66  Pulse: 74  Resp: 18  Temp: 98.1 F (36.7 C)     FHT:154 Lab orders placed from triage:

## 2023-05-03 NOTE — ED Provider Notes (Signed)
Patient here today for evaluation of vision changes to her right eye. She notes blurriness and seeing "black spots". She is currently 17-[redacted] weeks pregnant. She denies headache. She states she always has nausea. Encouraged follow up in ED for further evaluation. Patient is agreeable and will transport via POV.    Tomi Bamberger, PA-C 05/03/23 1228

## 2023-05-03 NOTE — ED Triage Notes (Signed)
Right eye pain, seeing black spots, blurry vision - Entered by patient  Date of injury "05-01-2023".

## 2023-05-03 NOTE — Discharge Instructions (Signed)
  For headaches:  - Look for TENSION headache formulation -- main ingredients should be Acetaminophen (same as Tylenol) and Caffeine; should NOT have Aspirin in the formulation

## 2023-06-06 ENCOUNTER — Inpatient Hospital Stay (HOSPITAL_COMMUNITY)
Admission: AD | Admit: 2023-06-06 | Discharge: 2023-06-06 | Disposition: A | Discharge status: Home / Self Care | Payer: 59 | Attending: Obstetrics & Gynecology | Admitting: Obstetrics & Gynecology

## 2023-06-06 ENCOUNTER — Other Ambulatory Visit: Payer: Self-pay

## 2023-06-06 ENCOUNTER — Encounter (HOSPITAL_COMMUNITY): Payer: Self-pay | Admitting: Obstetrics & Gynecology

## 2023-06-06 DIAGNOSIS — Z3A22 22 weeks gestation of pregnancy: Secondary | ICD-10-CM | POA: Diagnosis not present

## 2023-06-06 DIAGNOSIS — O368120 Decreased fetal movements, second trimester, not applicable or unspecified: Secondary | ICD-10-CM | POA: Diagnosis present

## 2023-06-06 DIAGNOSIS — Z3492 Encounter for supervision of normal pregnancy, unspecified, second trimester: Secondary | ICD-10-CM

## 2023-06-06 NOTE — MAU Provider Note (Addendum)
[name] is a 27 y.o. G2P1001 female at [redacted]w[redacted]d weeks gestation presenting to MAU with complaints of DFM since she woke up this morning. She reports "he normally moves a lot  as soon as I get up in the morning, but not this morning." MAU provider was requested by RN to verify viability via U/S. She receives [facility] with [facility].   Patient Vitals for the past 24 hrs:  BP Temp Temp src Pulse Resp SpO2  06/06/23 1440 124/61 98.2 F (36.8 C) Oral 82 17 99 %   FHTs by doppler: 165 bpm    Patient informed that the ultrasound is considered a limited OB ultrasound and is not intended to be a complete ultrasound exam.  Patient also informed that the ultrasound is not being completed with the intent of assessing for fetal or placental anomalies or any pelvic abnormalities.  Explained that the purpose of today's ultrasound is to assess for viability.  Baby was found to be active, drinking amniotic fluid and actively hiccupping. Explained her placenta is in an anterior position; which can cause the perception of decreased or no FM. Patient acknowledges the purpose of the exam and the limitations of the study.  1. Fetal heart rate present, second trimester   2. [redacted] weeks gestation of pregnancy   - Discharge home - Explained that placenta in the front of her uterus is more than likely the cause of her decreased perception of the baby's movements. - Reassurance given and witnessed by patient that baby is very active at this time. Patient shown baby drinking amniotic fluid and hiccupping on U/S - Call [facility] to get OB appt scheduled ASAP - Patient verbalized an understanding of the plan of care and agrees.   Raelyn Mora, CNM  06/06/2023 3:04 PM

## 2023-06-06 NOTE — MAU Note (Signed)
.  FRANCELIA WAUGAMAN is a 27 y.o. at [redacted]w[redacted]d here in MAU reporting: DFM since this morning.  Pt denies other complaints.   Pain score: 0 Vitals:   06/06/23 1440  BP: 124/61  Pulse: 82  Resp: 17  Temp: 98.2 F (36.8 C)  SpO2: 99%     FHT:165 Lab orders placed from triage:

## 2023-06-12 ENCOUNTER — Ambulatory Visit
Admission: RE | Admit: 2023-06-12 | Discharge: 2023-06-12 | Disposition: A | Discharge status: Home / Self Care | Payer: 59 | Source: Ambulatory Visit | Attending: Family Medicine | Admitting: Family Medicine

## 2023-06-12 ENCOUNTER — Other Ambulatory Visit: Payer: Self-pay

## 2023-06-12 VITALS — BP 94/55 | HR 79 | Temp 98.5°F | Resp 17

## 2023-06-12 DIAGNOSIS — N949 Unspecified condition associated with female genital organs and menstrual cycle: Secondary | ICD-10-CM | POA: Diagnosis not present

## 2023-06-12 DIAGNOSIS — Z3A23 23 weeks gestation of pregnancy: Secondary | ICD-10-CM | POA: Insufficient documentation

## 2023-06-12 DIAGNOSIS — Z202 Contact with and (suspected) exposure to infections with a predominantly sexual mode of transmission: Secondary | ICD-10-CM | POA: Diagnosis present

## 2023-06-12 DIAGNOSIS — O26892 Other specified pregnancy related conditions, second trimester: Secondary | ICD-10-CM | POA: Diagnosis not present

## 2023-06-12 NOTE — Discharge Instructions (Addendum)
Advised patient we will follow-up with lab results once received.  Encouraged increase daily water intake to 64 ounces per day.  Advised if symptoms worsen and/or unresolved please follow-up with OB/GYN or here for further evaluation.

## 2023-06-12 NOTE — ED Provider Notes (Signed)
[name]    CSN: [id number] Arrival date & time: 06/12/23  1245      History   Chief Complaint Chief Complaint  Patient presents with   SEXUALLY TRANSMITTED DISEASE   Abdominal Cramping    HPI Ashley Romero is a 27 y.o. female.   HPI 27 year old female presents with possible exposure to STD.  Patient is currently [redacted] weeks pregnant with a due date of 10/09/2023.  PMH significant for morbid obesity, ADHD, and anxiety.  Past Medical History:  Diagnosis Date   ADHD (attention deficit hyperactivity disorder)    Anemia    Anxiety    Constipation    Depression    GERD (gastroesophageal reflux disease)    OTC as needed   Gestational diabetes    Hypertension    Hypertrophy of tonsils and adenoids 08/2013   snores during sleep; had sleep study 03/2013:  AHI 1.9, RDI 2.3   Irregular menses    Nexplanon implant   Learning disorder involving mathematics    Migraines     Patient Active Problem List   Diagnosis Date Noted   History of cesarean section 05/03/2023   Excessive weight loss 05/03/2023   GBS bacteriuria 04/02/2023   Chlamydia trachomatis infection in pregnancy 03/15/2023   Herpes simplex 03/10/2023   Contraception management 01/22/2023   Genital herpes 12/18/2022   Major depressive disorder, recurrent episode, moderate (HCC) 06/10/2022   Generalized anxiety disorder 06/10/2022   History of pre-eclampsia 10/26/2016   Eczema 06/18/2016   Morbid obesity with BMI of 50.0-59.9, adult (HCC) 04/21/2016   Overweight 04/21/2016   History of gestational diabetes 03/25/2016   Preeclampsia, severe 01/16/2016   Boils of multiple sites 12/18/2015   Anxiety 11/22/2015   Gestational diabetes mellitus 11/22/2015   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   Influenza 09/06/2015   Intrauterine pregnancy in teenager 07/16/2015   Pregnancy 07/11/2015   PCOS (polycystic ovarian syndrome) 06/10/2015   Gastroenteritis 04/30/2015   Chronic daily headache 03/22/2015    Migraine 03/22/2015   Sleep apnea 03/22/2015   Bipolar I disorder, most recent episode depressed (HCC) 01/13/2015   Depressive disorder 01/10/2015   Acanthosis nigricans 06/25/2014   Seborrheic dermatitis 06/01/2014   Iron deficiency anemia 01/30/2014   Central auditory processing disorder 01/03/2014   School problem 08/25/2013   Acne vulgaris 05/02/2013   Fatigue 05/02/2013   Headache 11/03/2012   Morbid obesity (HCC) 11/03/2012   GERD (gastroesophageal reflux disease) 11/03/2012   Allergic rhinitis 11/03/2012   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   Constipation    ADHD (attention deficit hyperactivity disorder) 06/26/2011   Mood disorder (HCC) 06/26/2011    Past Surgical History:  Procedure Laterality Date   CESAREAN SECTION N/A 01/18/2016   Procedure: CESAREAN SECTION;  Surgeon: Myna Hidalgo, DO;  [facility];  Service: Obstetrics;  Laterality: N/A;   TONSILLECTOMY AND ADENOIDECTOMY Bilateral 09/25/2013   Procedure: BILATERAL TONSILLECTOMY AND ADENOIDECTOMY;  Surgeon: Darletta Moll, MD;  Location: [facility];  Service: ENT;  Laterality: Bilateral;   WISDOM TOOTH EXTRACTION  2015    OB History     Gravida  2   Para  1   Term  1   Preterm      AB      Living  1      SAB      IAB      Ectopic      Multiple  0   Live Births  1  Home Medications    Prior to Admission medications   Medication Sig Start Date End Date Taking? Authorizing Provider  hydrOXYzine (ATARAX) 10 MG tablet Take 10-20 mg by mouth at bedtime. 01/26/23   [provider]  valACYclovir (VALTREX) 1000 MG tablet Take 1,000 mg by mouth daily. 01/22/23 01/22/24  [provider]    Family History Family History  Problem Relation Age of Onset   Asthma Mother    Hypertension Mother    Anesthesia problems Mother        hard to wake up post-op   Migraines Mother    Depression Mother    Anxiety disorder Mother    Heart disease Maternal  Grandmother    Migraines Maternal Grandmother    Bipolar disorder Maternal Aunt    Schizophrenia Maternal Aunt    Depression Maternal Aunt    ADD / ADHD Cousin        Many paternal 1 st cousins have ADD/ADHD    Social History Social History   Tobacco Use   Smoking status: Former    Types: Cigars    Quit date: 02/28/2023    Years since quitting: 0.2   Smokeless tobacco: Never   Tobacco comments:    outside smokers at home  Vaping Use   Vaping status: Never Used  Substance Use Topics   Alcohol use: No    Alcohol/week: 0.0 standard drinks of alcohol   Drug use: Not Currently    Types: Marijuana    Comment: smokes marijuana daily      Allergies   Patient has no known allergies.   Review of Systems Review of Systems   Physical Exam Triage Vital Signs ED Triage Vitals  Encounter Vitals Group     BP      Systolic BP Percentile      Diastolic BP Percentile      Pulse      Resp      Temp      Temp src      SpO2      Weight      Height      Head Circumference      Peak Flow      Pain Score      Pain Loc      Pain Education      Exclude from Growth Chart    No data found.  Updated Vital Signs BP (!) 94/55 (BP Location: Left Arm)   Pulse 79   Temp 98.5 F (36.9 C) (Oral)   Resp 17   LMP 01/02/2023 (Exact Date)   SpO2 100%   Visual Acuity Right Eye Distance:   Left Eye Distance:   Bilateral Distance:    Right Eye Near:   Left Eye Near:    Bilateral Near:     Physical Exam Vitals and nursing note reviewed. Exam conducted with a chaperone present.  Constitutional:      Appearance: Normal appearance. She is obese.  HENT:     Head: Normocephalic and atraumatic.     Mouth/Throat:     Mouth: Mucous membranes are moist.     Pharynx: Oropharynx is clear.  Eyes:     Extraocular Movements: Extraocular movements intact.     Conjunctiva/sclera: Conjunctivae normal.     Pupils: Pupils are equal, round, and reactive to light.  Cardiovascular:     Rate  and Rhythm: Normal rate and regular rhythm.     Pulses: Normal pulses.     Heart sounds:  Normal heart sounds.  Pulmonary:     Effort: Pulmonary effort is normal.     Breath sounds: Normal breath sounds. No wheezing, rhonchi or rales.  Genitourinary:      Comments: Red area above represents tiny (<0.5 cm) erythematous vesicular lesion noted on exam Musculoskeletal:        General: Normal range of motion.     Cervical back: Normal range of motion and neck supple.  Skin:    General: Skin is warm and dry.  Neurological:     General: No focal deficit present.     Mental Status: She is alert and oriented to person, place, and time. Mental status is at baseline.  Psychiatric:        Mood and Affect: Mood normal.        Behavior: Behavior normal.      UC Treatments / Results  Labs (all labs ordered are listed, but only abnormal results are displayed) Labs Reviewed  HSV CULTURE AND TYPING  HIV ANTIBODY (ROUTINE TESTING W REFLEX)  RPR  HEPATITIS C ANTIBODY  CERVICOVAGINAL ANCILLARY ONLY    EKG   Radiology No results found.  Procedures Procedures (including critical care time)  Medications Ordered in UC Medications - No data to display  Initial Impression / Assessment and Plan / UC Course  I have reviewed the triage vital signs and the nursing notes.  Pertinent labs & imaging results that were available during my care of the patient were reviewed by me and considered in my medical decision making (see chart for details).     MDM: 1.  Potential exposure to STD-Aptima swab ordered (GC chlamydia, trichomonas, RPR, hep C, HIV antibody (routine testing with reflex) ordered; 2.  Genital lesion, female-HSV culture and typing sample obtained with chaperone present. Advised patient we will follow-up with lab results once received.  Encouraged increase daily water intake to 64 ounces per day.  Advised if symptoms worsen and/or unresolved please follow-up with OB/GYN or here for  further evaluation.  Patient discharged home, hemodynamically stable. Final Clinical Impressions(s) / UC Diagnoses   Final diagnoses:  Genital lesion, female  Potential exposure to STD     Discharge Instructions      Advised patient we will follow-up with lab results once received.  Encouraged increase daily water intake to 64 ounces per day.  Advised if symptoms worsen and/or unresolved please follow-up with OB/GYN or here for further evaluation.     ED Prescriptions   None    PDMP not reviewed this encounter.   Trevor Iha, FNP 06/12/23 1413

## 2023-06-12 NOTE — ED Triage Notes (Signed)
Pt c/o abd cramping x 2 days. Would like STD testing today. Pt is currently [redacted] wks pregnant. EDD 10/09/23

## 2023-06-14 LAB — CERVICOVAGINAL ANCILLARY ONLY
Bacterial Vaginitis (gardnerella): NEGATIVE
Candida Glabrata: NEGATIVE
Candida Vaginitis: POSITIVE — AB
Chlamydia: NEGATIVE
Comment: NEGATIVE
Comment: NEGATIVE
Comment: NEGATIVE
Comment: NEGATIVE
Comment: NEGATIVE
Comment: NORMAL
Neisseria Gonorrhea: NEGATIVE
Trichomonas: NEGATIVE

## 2023-06-15 LAB — HSV CULTURE AND TYPING

## 2023-06-16 ENCOUNTER — Telehealth (HOSPITAL_COMMUNITY): Payer: Self-pay

## 2023-06-16 ENCOUNTER — Encounter (HOSPITAL_COMMUNITY): Payer: Self-pay

## 2023-06-16 MED ORDER — CLOTRIMAZOLE 1 % VA CREA: 1.0000 | TOPICAL_CREAM | Freq: Every day | VAGINAL | 0 refills | Status: AC

## 2023-06-16 NOTE — Telephone Encounter (Signed)
See result note.  

## 2023-06-29 ENCOUNTER — Observation Stay (HOSPITAL_COMMUNITY)
Admission: AD | Admit: 2023-06-29 | Discharge: 2023-06-30 | Disposition: A | Discharge status: Home / Self Care | Payer: 59 | Attending: Obstetrics and Gynecology | Admitting: Obstetrics and Gynecology

## 2023-06-29 ENCOUNTER — Other Ambulatory Visit: Payer: Self-pay

## 2023-06-29 ENCOUNTER — Encounter (HOSPITAL_COMMUNITY): Payer: Self-pay | Admitting: *Deleted

## 2023-06-29 DIAGNOSIS — O24419 Gestational diabetes mellitus in pregnancy, unspecified control: Secondary | ICD-10-CM | POA: Diagnosis not present

## 2023-06-29 DIAGNOSIS — S3991XA Unspecified injury of abdomen, initial encounter: Secondary | ICD-10-CM | POA: Diagnosis not present

## 2023-06-29 DIAGNOSIS — O9A212 Injury, poisoning and certain other consequences of external causes complicating pregnancy, second trimester: Secondary | ICD-10-CM | POA: Diagnosis present

## 2023-06-29 DIAGNOSIS — O26892 Other specified pregnancy related conditions, second trimester: Secondary | ICD-10-CM | POA: Diagnosis not present

## 2023-06-29 DIAGNOSIS — Z3A25 25 weeks gestation of pregnancy: Secondary | ICD-10-CM | POA: Diagnosis not present

## 2023-06-29 DIAGNOSIS — O139 Gestational [pregnancy-induced] hypertension without significant proteinuria, unspecified trimester: Secondary | ICD-10-CM | POA: Insufficient documentation

## 2023-06-29 DIAGNOSIS — Z87891 Personal history of nicotine dependence: Secondary | ICD-10-CM | POA: Diagnosis not present

## 2023-06-29 LAB — TYPE AND SCREEN
ABO/RH(D): O POS
Antibody Screen: NEGATIVE

## 2023-06-29 LAB — URINALYSIS, ROUTINE W REFLEX MICROSCOPIC
Bilirubin Urine: NEGATIVE
Glucose, UA: NEGATIVE mg/dL
Ketones, ur: NEGATIVE mg/dL
Nitrite: NEGATIVE
Protein, ur: NEGATIVE mg/dL
Specific Gravity, Urine: 1.019 (ref 1.005–1.030)
pH: 5 (ref 5.0–8.0)

## 2023-06-29 LAB — HEPATITIS C ANTIBODY: Hep C Virus Ab: NONREACTIVE

## 2023-06-29 LAB — HIV ANTIBODY (ROUTINE TESTING W REFLEX): HIV Screen 4th Generation wRfx: NONREACTIVE

## 2023-06-29 LAB — RPR: RPR Ser Ql: NONREACTIVE

## 2023-06-29 MED ORDER — LACTATED RINGERS IV BOLUS
1000.0000 mL | Freq: Once | INTRAVENOUS | Status: AC
  Administered 2023-06-29: 1000 mL via INTRAVENOUS

## 2023-06-29 MED ORDER — CYCLOBENZAPRINE HCL 5 MG PO TABS
10.0000 mg | ORAL_TABLET | Freq: Once | ORAL | Status: AC
  Administered 2023-06-29: 10 mg via ORAL
  Filled 2023-06-29: qty 2

## 2023-06-29 MED ORDER — ACETAMINOPHEN 500 MG PO TABS
1000.0000 mg | ORAL_TABLET | Freq: Once | ORAL | Status: AC
  Administered 2023-06-29: 1000 mg via ORAL
  Filled 2023-06-29: qty 2

## 2023-06-29 NOTE — Progress Notes (Signed)
To [facility]  via w/c °
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
## 2023-06-29 NOTE — Progress Notes (Addendum)
PT given 'clicker' to mark FM and voices understanding. Pt does not need to void currently so water to pt

## 2023-06-29 NOTE — Progress Notes (Signed)
Pt up to BR
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
## 2023-06-30 DIAGNOSIS — O9A212 Injury, poisoning and certain other consequences of external causes complicating pregnancy, second trimester: Secondary | ICD-10-CM | POA: Diagnosis not present

## 2023-06-30 LAB — KLEIHAUER-BETKE STAIN
Fetal Cells %: 0 %
Quantitation Fetal Hemoglobin: 0 mL
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
## 2023-06-30 NOTE — L&D Delivery Note (Addendum)
 OB/GYN [facility] Delivery Note  Ashley Romero is a 28 y.o. G2P1001 s/p VBAC at [redacted]w[redacted]d. She was admitted for eIOL.   ROM: 4h 52m with clear fluid GBS Status:  Positive/-- (03/10 0000), PCN ppx Maximum Maternal Temperature: 64F  Labor Progress: Initial SVE: closed. She then progressed to complete.   Delivery Date/Time: 10/09/2023 @1042  Delivery: Called to room and patient was complete and pushing. Head delivered LOA. No nuchal cord present. Shoulder and body delivered in usual fashion. Infant with spontaneous cry, placed on mother's abdomen, dried and stimulated. Cord clamped x 2 after 1-minute delay, and cut by the patient's partner. A cord gas was collected but not enough sample retrieved. Cord blood drawn. Placenta delivered spontaneously with gentle cord traction. Fundus firm with massage and Pitocin. Labia, perineum, vagina, and cervix inspected. There was a right vaginal side wall abrasion with brisk bleeding. Hemostasis not achieved with pressure and was repaired with 4.0 monocryl figure of eight stitch.  Baby Weight: pending  Placenta: 3 vessel, intact. Sent to L&D Complications: None Lacerations: As above.  EBL: 275 mL Analgesia: Epidural   Infant:  APGAR (1 MIN): 8  APGAR (5 MINS): 9   Ayala Ribble Autry-Lott, DO 10/09/2023, 10:59 AM
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
## 2023-07-04 ENCOUNTER — Encounter (HOSPITAL_COMMUNITY): Payer: Self-pay | Admitting: Obstetrics and Gynecology

## 2023-07-04 ENCOUNTER — Inpatient Hospital Stay (HOSPITAL_COMMUNITY)
Admission: AD | Admit: 2023-07-04 | Discharge: 2023-07-04 | Disposition: A | Discharge status: Home / Self Care | Payer: 59 | Attending: Obstetrics and Gynecology | Admitting: Obstetrics and Gynecology

## 2023-07-04 DIAGNOSIS — O98812 Other maternal infectious and parasitic diseases complicating pregnancy, second trimester: Secondary | ICD-10-CM | POA: Insufficient documentation

## 2023-07-04 DIAGNOSIS — Z3A26 26 weeks gestation of pregnancy: Secondary | ICD-10-CM | POA: Diagnosis not present

## 2023-07-04 DIAGNOSIS — N949 Unspecified condition associated with female genital organs and menstrual cycle: Secondary | ICD-10-CM

## 2023-07-04 DIAGNOSIS — B9689 Other specified bacterial agents as the cause of diseases classified elsewhere: Secondary | ICD-10-CM | POA: Diagnosis not present

## 2023-07-04 DIAGNOSIS — B3731 Acute candidiasis of vulva and vagina: Secondary | ICD-10-CM | POA: Insufficient documentation

## 2023-07-04 DIAGNOSIS — O23592 Infection of other part of genital tract in pregnancy, second trimester: Secondary | ICD-10-CM

## 2023-07-04 LAB — URINALYSIS, ROUTINE W REFLEX MICROSCOPIC
Bilirubin Urine: NEGATIVE
Glucose, UA: NEGATIVE mg/dL
Hgb urine dipstick: NEGATIVE
Ketones, ur: NEGATIVE mg/dL
Nitrite: NEGATIVE
Protein, ur: NEGATIVE mg/dL
Specific Gravity, Urine: 1.011 (ref 1.005–1.030)
pH: 6 (ref 5.0–8.0)

## 2023-07-04 LAB — WET PREP, GENITAL
Sperm: NONE SEEN
Trich, Wet Prep: NONE SEEN
WBC, Wet Prep HPF POC: >=10 — AB (ref <10)
Yeast Wet Prep HPF POC: NONE SEEN

## 2023-07-04 MED ORDER — METRONIDAZOLE 500 MG PO TABS: 500.0000 mg | ORAL_TABLET | Freq: Two times a day (BID) | ORAL | 0 refills | Status: DC

## 2023-07-04 MED ORDER — FLUCONAZOLE 150 MG PO TABS: 150.0000 mg | ORAL_TABLET | Freq: Every day | ORAL | 1 refills | Status: DC
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
## 2023-07-04 NOTE — Discharge Instructions (Signed)

## 2023-07-05 LAB — GC/CHLAMYDIA PROBE AMP (~~LOC~~) NOT AT ARMC
Chlamydia: NEGATIVE
Comment: NEGATIVE
Comment: NORMAL
Neisseria Gonorrhea: NEGATIVE

## 2023-07-21 ENCOUNTER — Ambulatory Visit: Payer: 59 | Admitting: Podiatry

## 2023-07-28 ENCOUNTER — Inpatient Hospital Stay (HOSPITAL_COMMUNITY)
Admission: AD | Admit: 2023-07-28 | Discharge: 2023-07-29 | Disposition: A | Discharge status: Home / Self Care | Payer: 59 | Attending: Family Medicine | Admitting: Family Medicine

## 2023-07-28 ENCOUNTER — Encounter: Payer: Self-pay | Admitting: Podiatry

## 2023-07-28 ENCOUNTER — Ambulatory Visit (INDEPENDENT_AMBULATORY_CARE_PROVIDER_SITE_OTHER): Payer: 59 | Admitting: Podiatry

## 2023-07-28 ENCOUNTER — Encounter (HOSPITAL_COMMUNITY): Payer: Self-pay | Admitting: Obstetrics & Gynecology

## 2023-07-28 DIAGNOSIS — N930 Postcoital and contact bleeding: Secondary | ICD-10-CM | POA: Diagnosis not present

## 2023-07-28 DIAGNOSIS — S30814A Abrasion of vagina and vulva, initial encounter: Secondary | ICD-10-CM | POA: Insufficient documentation

## 2023-07-28 DIAGNOSIS — Z3A29 29 weeks gestation of pregnancy: Secondary | ICD-10-CM | POA: Diagnosis not present

## 2023-07-28 DIAGNOSIS — L603 Nail dystrophy: Secondary | ICD-10-CM | POA: Diagnosis not present

## 2023-07-28 DIAGNOSIS — O9A213 Injury, poisoning and certain other consequences of external causes complicating pregnancy, third trimester: Secondary | ICD-10-CM | POA: Diagnosis not present

## 2023-07-28 DIAGNOSIS — B9689 Other specified bacterial agents as the cause of diseases classified elsewhere: Secondary | ICD-10-CM | POA: Insufficient documentation

## 2023-07-28 DIAGNOSIS — O26853 Spotting complicating pregnancy, third trimester: Secondary | ICD-10-CM | POA: Insufficient documentation

## 2023-07-28 DIAGNOSIS — O26893 Other specified pregnancy related conditions, third trimester: Secondary | ICD-10-CM | POA: Diagnosis not present

## 2023-07-28 DIAGNOSIS — O23593 Infection of other part of genital tract in pregnancy, third trimester: Secondary | ICD-10-CM | POA: Insufficient documentation
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
## 2023-07-28 NOTE — Progress Notes (Signed)
Subjective:  Patient ID: Ashley Romero, female    DOB: 1995-09-14,  MRN: [id number]  Chief Complaint  Patient presents with   Nail Problem    RM#14 Right foot big toe nail split and needs to be removed.    28 y.o. female presents with the above complaint.  Patient presents with right hallux nail contusion with lifting of the nail.  Patient states the nail is mostly detached.  It is attached at the distal part of the nail.  She tried to remove it but is causing her some pain she would like for me to do it denies any other acute go pain scale 5 out of 10 dull aching nature has not seen MRIs prior to seeing me.   Review of Systems: Negative except as noted in the HPI. Denies N/V/F/Ch.  Past Medical History:  Diagnosis Date   ADHD (attention deficit hyperactivity disorder)    Anemia    Anxiety    Constipation    Depression    GERD (gastroesophageal reflux disease)    OTC as needed   Gestational diabetes    Hypertension    Hypertrophy of tonsils and adenoids 08/2013   snores during sleep; had sleep study 03/2013:  AHI 1.9, RDI 2.3   Irregular menses    Nexplanon implant   Learning disorder involving mathematics    Migraines     Current Outpatient Medications:    fluconazole (DIFLUCAN) 150 MG tablet, Take 1 tablet (150 mg total) by mouth daily., Disp: 1 tablet, Rfl: 1   hydrOXYzine (ATARAX) 10 MG tablet, Take 10-20 mg by mouth at bedtime. Pt not taking. No needed, Disp: , Rfl:    metroNIDAZOLE (FLAGYL) 500 MG tablet, Take 1 tablet (500 mg total) by mouth 2 (two) times daily., Disp: 14 tablet, Rfl: 0   valACYclovir (VALTREX) 1000 MG tablet, Take 1,000 mg by mouth daily., Disp: , Rfl:   Social History   Tobacco Use  Smoking Status Former   Types: Cigars   Quit date: 02/28/2023   Years since quitting: 0.4  Smokeless Tobacco Never  Tobacco Comments   outside smokers at home    No Known Allergies Objective:  There were no vitals filed for this visit. There is no height or  weight on file to calculate BMI. Constitutional Well developed. Well nourished.  Vascular Dorsalis pedis pulses palpable bilaterally. Posterior tibial pulses palpable bilaterally. Capillary refill normal to all digits.  No cyanosis or clubbing noted. Pedal hair growth normal.  Neurologic Normal speech. Oriented to person, place, and time. Epicritic sensation to light touch grossly present bilaterally.  Dermatologic Pain on palpation of the entire/total nail on 1st digit of the right No other open wounds. No skin lesions.  Orthopedic: Normal joint ROM without pain or crepitus bilaterally. No visible deformities. No bony tenderness.   Radiographs: None Assessment:   1. Nail dystrophy    Plan:  Patient was evaluated and treated and all questions answered.  Nail contusion/dystrophy hallux, right -Patient elects to proceed with minor surgery to remove entire toenail today. Consent reviewed and signed by patient. -Entire/total nail excised. See procedure note. -Educated on post-procedure care including soaking. Written instructions provided and reviewed. -Patient to follow up in 2 weeks for nail check.  Procedure: Excision of entire/total nail  Location: Right 1st toe digit Anesthesia: Lidocaine 1% plain; 1.5 mL and Marcaine 0.5% plain; 1.5 mL, digital block. Skin Prep: Betadine. Dressing: Silvadene; telfa; dry, sterile, compression dressing. Technique: Following skin prep, the toe was  exsanguinated and a tourniquet was secured at the base of the toe. The affected nail border was freed and excised. The tourniquet was then removed and sterile dressing applied. Disposition: Patient tolerated procedure well. Patient to return in 2 weeks for follow-up.   No follow-ups on file.

## 2023-07-29 DIAGNOSIS — N930 Postcoital and contact bleeding: Secondary | ICD-10-CM

## 2023-07-29 DIAGNOSIS — O26893 Other specified pregnancy related conditions, third trimester: Secondary | ICD-10-CM

## 2023-07-29 DIAGNOSIS — O26853 Spotting complicating pregnancy, third trimester: Secondary | ICD-10-CM | POA: Diagnosis not present

## 2023-07-29 DIAGNOSIS — Z3A29 29 weeks gestation of pregnancy: Secondary | ICD-10-CM

## 2023-07-29 LAB — URINALYSIS, ROUTINE W REFLEX MICROSCOPIC
Bacteria, UA: NONE SEEN
Bilirubin Urine: NEGATIVE
Glucose, UA: NEGATIVE mg/dL
Ketones, ur: NEGATIVE mg/dL
Nitrite: NEGATIVE
Protein, ur: NEGATIVE mg/dL
Specific Gravity, Urine: 1.015 (ref 1.005–1.030)
pH: 7 (ref 5.0–8.0)

## 2023-07-29 LAB — CBC
HCT: 29.2 % — ABNORMAL LOW (ref 36.0–46.0)
Hemoglobin: 10.1 g/dL — ABNORMAL LOW (ref 12.0–15.0)
MCH: 30.9 pg (ref 26.0–34.0)
MCHC: 34.6 g/dL (ref 30.0–36.0)
MCV: 89.3 fL (ref 80.0–100.0)
Platelets: 161 10*3/uL (ref 150–400)
RBC: 3.27 MIL/uL — ABNORMAL LOW (ref 3.87–5.11)
RDW: 13.9 % (ref 11.5–15.5)
WBC: 13.5 10*3/uL — ABNORMAL HIGH (ref 4.0–10.5)
nRBC: 0 % (ref 0.0–0.2)

## 2023-07-29 LAB — WET PREP, GENITAL
Clue Cells Wet Prep HPF POC: NONE SEEN
Sperm: NONE SEEN
Trich, Wet Prep: NONE SEEN
WBC, Wet Prep HPF POC: >=10 — AB (ref <10)
Yeast Wet Prep HPF POC: NONE SEEN

## 2023-07-29 MED ORDER — METRONIDAZOLE 500 MG PO TABS: 500.0000 mg | ORAL_TABLET | Freq: Two times a day (BID) | ORAL | 0 refills | Status: AC

## 2023-07-29 NOTE — MAU Provider Note (Signed)
History     CSN: [id number]  Arrival date and time: 07/28/23 2313   Event Date/Time   First Provider Initiated Contact with Patient    Chief Complaint  Patient presents with   Vaginal Bleeding   Abdominal Pain    HPI  Ashley Romero is a 28 y.o. G2P1001 at [redacted]w[redacted]d who presents to the MAU for postcoital spotting. Started after having intercourse today. Reports pinkish discharge with wiping, no clots, or bright red bleeding. No LOF, ctx. Having good FM. Denies any pain. Recently had vulvovaginal candidiasis, took Diflucan. Still having some vaginal discharge w fishy odor.  Past Medical History:  Diagnosis Date   ADHD (attention deficit hyperactivity disorder)    Anemia    Anxiety    Constipation    Depression    GERD (gastroesophageal reflux disease)    OTC as needed   Gestational diabetes    Hypertension    Hypertrophy of tonsils and adenoids 08/2013   snores during sleep; had sleep study 03/2013:  AHI 1.9, RDI 2.3   Irregular menses    Nexplanon implant   Learning disorder involving mathematics    Migraines     Past Surgical History:  Procedure Laterality Date   CESAREAN SECTION N/A 01/18/2016   Procedure: CESAREAN SECTION;  Surgeon: Myna Hidalgo, DO;  Location: [facility];  Service: Obstetrics;  Laterality: N/A;   TONSILLECTOMY AND ADENOIDECTOMY Bilateral 09/25/2013   Procedure: BILATERAL TONSILLECTOMY AND ADENOIDECTOMY;  Surgeon: Darletta Moll, MD;  Location: [facility];  Service: ENT;  Laterality: Bilateral;   WISDOM TOOTH EXTRACTION  2015    Family History  Problem Relation Age of Onset   Asthma Mother    Hypertension Mother    Anesthesia problems Mother        hard to wake up post-op   Migraines Mother    Depression Mother    Anxiety disorder Mother    Heart disease Maternal Grandmother    Migraines Maternal Grandmother    Bipolar disorder Maternal Aunt    Schizophrenia Maternal Aunt    Depression Maternal Aunt    ADD / ADHD Cousin         Many paternal 1 st cousins have ADD/ADHD    Social History   Tobacco Use   Smoking status: Former    Types: Cigars    Quit date: 02/28/2023    Years since quitting: 0.4   Smokeless tobacco: Never   Tobacco comments:    outside smokers at home  Vaping Use   Vaping status: Never Used  Substance Use Topics   Alcohol use: No    Alcohol/week: 0.0 standard drinks of alcohol   Drug use: Not Currently    Types: Marijuana    Comment: smokes marijuana daily     Allergies: No Known Allergies  Medications Prior to Admission  Medication Sig Dispense Refill Last Dose/Taking   fluconazole (DIFLUCAN) 150 MG tablet Take 1 tablet (150 mg total) by mouth daily. 1 tablet 1    hydrOXYzine (ATARAX) 10 MG tablet Take 10-20 mg by mouth at bedtime. Pt not taking. No needed      metroNIDAZOLE (FLAGYL) 500 MG tablet Take 1 tablet (500 mg total) by mouth 2 (two) times daily. 14 tablet 0    valACYclovir (VALTREX) 1000 MG tablet Take 1,000 mg by mouth daily.       ROS reviewed and pertinent positives and negatives as documented in HPI.  Physical Exam   Blood pressure 113/69, pulse 80,  temperature 98 F (36.7 C), temperature source Oral, resp. rate 19, height 5' (1.524 m), weight 89.9 kg, last menstrual period 01/02/2023, SpO2 100%.  Physical Exam Exam conducted with a chaperone present.  Constitutional:      General: She is not in acute distress.    Appearance: Normal appearance. She is not ill-appearing.  HENT:     Head: Normocephalic and atraumatic.  Cardiovascular:     Rate and Rhythm: Normal rate.  Pulmonary:     Effort: Pulmonary effort is normal.     Breath sounds: Normal breath sounds.  Abdominal:     Palpations: Abdomen is soft.     Tenderness: There is no abdominal tenderness. There is no guarding.  Genitourinary:    Comments: On speculum exam -- pt had 2-3 vaginal wall abrasions, subcentimeter, w no active bleeding noted, cervix closed, no bleeding from cervix or from os,  +thin, whitish-green discharge noted Musculoskeletal:        General: Normal range of motion.  Skin:    General: Skin is warm and dry.     Findings: No rash.  Neurological:     General: No focal deficit present.     Mental Status: She is alert and oriented to person, place, and time.   EFM: 150/mod/+10x10 accels/no decels  MAU Course  Procedures  MDM 28 y.o. G2P1001 at [redacted]w[redacted]d presenting for postcoital vaginal bleeding. Reports symptom onset after intercourse, scant amount of bleeding, only w wiping and no passage of clots/bright red bleeding. Her vital signs are stable, she is well appearing, and vaginal exam reveals abrasions, which are likely source of her bleeding. Wet prep, GC/CT, and CBC obtained. EFM appropriate based on GA.  1:08 AM Wet prep back -- WBC present. No clue cells, however based on history and physical exam, c/w BV, will tx with course of Flagyl.   1:48 AM  Discussed pt with on-call OB, Dr. Sallye Ober, who agrees with plan of care. Stable for discharge, given return precautions.  Assessment and Plan  Postcoital bleeding  Abrasion of vagina, initial encounter  BV (bacterial vaginosis) Suspect bleeding 2/2 abrasions, no bleeding from cervical os or cervix itself, abrasions of vaginal wall noted Given high suspicion for BV, will tx with Flagyl Return precautions discussed Stable for d/c    Sundra Aland, MD OB Fellow, Faculty Practice [facility], [facility]  07/29/2023, 12:35 AM

## 2023-07-30 LAB — GC/CHLAMYDIA PROBE AMP (~~LOC~~) NOT AT ARMC
Chlamydia: NEGATIVE
Comment: NEGATIVE
Comment: NORMAL
Neisseria Gonorrhea: NEGATIVE

## 2023-08-13 ENCOUNTER — Ambulatory Visit
Admission: EM | Admit: 2023-08-13 | Discharge: 2023-08-13 | Disposition: A | Discharge status: Home / Self Care | Payer: 59 | Attending: Internal Medicine | Admitting: Internal Medicine

## 2023-08-13 ENCOUNTER — Encounter: Payer: Self-pay | Admitting: Emergency Medicine

## 2023-08-13 DIAGNOSIS — Z3A33 33 weeks gestation of pregnancy: Secondary | ICD-10-CM

## 2023-08-13 DIAGNOSIS — J111 Influenza due to unidentified influenza virus with other respiratory manifestations: Secondary | ICD-10-CM

## 2023-08-13 DIAGNOSIS — Z20828 Contact with and (suspected) exposure to other viral communicable diseases: Secondary | ICD-10-CM

## 2023-08-13 DIAGNOSIS — O99513 Diseases of the respiratory system complicating pregnancy, third trimester: Secondary | ICD-10-CM

## 2023-08-13 LAB — POC COVID19/FLU A&B COMBO
Covid Antigen, POC: NEGATIVE
Influenza A Antigen, POC: NEGATIVE
Influenza B Antigen, POC: NEGATIVE

## 2023-08-13 MED ORDER — OSELTAMIVIR PHOSPHATE 75 MG PO CAPS: 75.0000 mg | ORAL_CAPSULE | Freq: Two times a day (BID) | ORAL | 0 refills | Status: DC

## 2023-08-13 NOTE — Discharge Instructions (Addendum)
You likely have the flu. Take Tamiflu every 12 hours for the next 5 days to improve symptoms and stop the virus from replicating in your body. Tylenol as needed for fevers and body aches. Mucinex as needed for nasal congestion.  If you develop any new or worsening symptoms or if your symptoms do not start to improve, please return here or follow-up with your primary care provider. If your symptoms are severe, please go to the emergency room.

## 2023-08-13 NOTE — ED Triage Notes (Addendum)
Pt c/o cough and headache for 2 days. Pt is asking for paper prescription
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
## 2023-09-06 LAB — OB RESULTS CONSOLE GBS: GBS: POSITIVE

## 2023-09-13 ENCOUNTER — Encounter (HOSPITAL_COMMUNITY): Payer: Self-pay | Admitting: Obstetrics & Gynecology

## 2023-09-13 ENCOUNTER — Inpatient Hospital Stay (HOSPITAL_COMMUNITY)
Admission: AD | Admit: 2023-09-13 | Discharge: 2023-09-13 | Disposition: A | Discharge status: Home / Self Care | Attending: Obstetrics & Gynecology | Admitting: Obstetrics & Gynecology

## 2023-09-13 ENCOUNTER — Other Ambulatory Visit: Payer: Self-pay

## 2023-09-13 ENCOUNTER — Inpatient Hospital Stay (HOSPITAL_COMMUNITY)

## 2023-09-13 DIAGNOSIS — W0110XA Fall on same level from slipping, tripping and stumbling with subsequent striking against unspecified object, initial encounter: Secondary | ICD-10-CM | POA: Diagnosis not present

## 2023-09-13 DIAGNOSIS — W010XXS Fall on same level from slipping, tripping and stumbling without subsequent striking against object, sequela: Secondary | ICD-10-CM | POA: Insufficient documentation

## 2023-09-13 DIAGNOSIS — R102 Pelvic and perineal pain: Secondary | ICD-10-CM | POA: Diagnosis not present

## 2023-09-13 DIAGNOSIS — W19XXXS Unspecified fall, sequela: Secondary | ICD-10-CM | POA: Diagnosis not present

## 2023-09-13 DIAGNOSIS — Z3A36 36 weeks gestation of pregnancy: Secondary | ICD-10-CM

## 2023-09-13 DIAGNOSIS — O9A213 Injury, poisoning and certain other consequences of external causes complicating pregnancy, third trimester: Secondary | ICD-10-CM | POA: Diagnosis not present

## 2023-09-13 DIAGNOSIS — O34219 Maternal care for unspecified type scar from previous cesarean delivery: Secondary | ICD-10-CM

## 2023-09-13 DIAGNOSIS — O26893 Other specified pregnancy related conditions, third trimester: Secondary | ICD-10-CM | POA: Insufficient documentation

## 2023-09-13 DIAGNOSIS — O99213 Obesity complicating pregnancy, third trimester: Secondary | ICD-10-CM | POA: Diagnosis not present

## 2023-09-13 DIAGNOSIS — O09293 Supervision of pregnancy with other poor reproductive or obstetric history, third trimester: Secondary | ICD-10-CM

## 2023-09-13 DIAGNOSIS — O368130 Decreased fetal movements, third trimester, not applicable or unspecified: Secondary | ICD-10-CM | POA: Insufficient documentation

## 2023-09-13 DIAGNOSIS — E669 Obesity, unspecified: Secondary | ICD-10-CM

## 2023-09-13 MED ORDER — CYCLOBENZAPRINE HCL 10 MG PO TABS: 10.0000 mg | ORAL_TABLET | Freq: Three times a day (TID) | ORAL | 1 refills | Status: DC | PRN

## 2023-09-13 MED ORDER — CYCLOBENZAPRINE HCL 5 MG PO TABS
10.0000 mg | ORAL_TABLET | Freq: Once | ORAL | Status: AC
  Administered 2023-09-13: 10 mg via ORAL
  Filled 2023-09-13: qty 2

## 2023-09-13 MED ORDER — ACETAMINOPHEN 500 MG PO TABS
1000.0000 mg | ORAL_TABLET | Freq: Once | ORAL | Status: AC
  Administered 2023-09-13: 1000 mg via ORAL
  Filled 2023-09-13: qty 2

## 2023-09-13 NOTE — MAU Note (Signed)
 Ashley Romero is a 28 y.o. at [redacted]w[redacted]d here in MAU reporting: she fell 30 minutes to 1 hour ago and landed onto her abdomen.  States she slipped in mud.  Denies VB or LOF.  Reports no FM felt since fall.  Reports having intermittent cramping in lower abdomen since fall.   Reports felt FM while in triage  LMP: NA Onset of complaint: today Pain score: 3 There were no vitals filed for this visit.   FHT: 159 bpm  Lab orders placed from triage: None

## 2023-09-13 NOTE — MAU Provider Note (Signed)
 History     CSN: [id number]  Arrival date and time: 09/13/23 1415   Event Date/Time   First Provider Initiated Contact with Patient 09/13/23 1500      Chief Complaint  Patient presents with   Greggory Keen , a  28 y.o. G2P1001 at [redacted]w[redacted]d presents to MAU after a fall. Patient states that she was running and slipped in the mud. Reports attempting to catch herself but states that she fell directly on her abdomen. She states that this happened at 230pm. Currently reports "some" fetal movement, but reports not as active normal. She denies vaginal bleeding, leaking of fluid since the incident. Patient reports coming right to MAU after the fall. She also reports some mild pain to the left pelvis and thigh. Reports as a pulling achy feeling. Denies attempting to relieve symptoms.          OB History     Gravida  2   Para  1   Term  1   Preterm      AB      Living  1      SAB      IAB      Ectopic      Multiple  0   Live Births  1           Past Medical History:  Diagnosis Date   ADHD (attention deficit hyperactivity disorder)    Anemia    Anxiety    Constipation    Depression    GERD (gastroesophageal reflux disease)    OTC as needed   Gestational diabetes    Hypertension    Hypertrophy of tonsils and adenoids 08/2013   snores during sleep; had sleep study 03/2013:  AHI 1.9, RDI 2.3   Irregular menses    Nexplanon implant   Learning disorder involving mathematics    Migraines     Past Surgical History:  Procedure Laterality Date   CESAREAN SECTION N/A 01/18/2016   Procedure: CESAREAN SECTION;  Surgeon: Myna Hidalgo, DO;  [facility];  Service: Obstetrics;  Laterality: N/A;   TONSILLECTOMY AND ADENOIDECTOMY Bilateral 09/25/2013   Procedure: BILATERAL TONSILLECTOMY AND ADENOIDECTOMY;  Surgeon: Darletta Moll, MD;  Location: [facility];  Service: ENT;  Laterality: Bilateral;   WISDOM TOOTH EXTRACTION  2015     Family History  Problem Relation Age of Onset   Asthma Mother    Hypertension Mother    Anesthesia problems Mother        hard to wake up post-op   Migraines Mother    Depression Mother    Anxiety disorder Mother    Heart disease Maternal Grandmother    Migraines Maternal Grandmother    Bipolar disorder Maternal Aunt    Schizophrenia Maternal Aunt    Depression Maternal Aunt    ADD / ADHD Cousin        Many paternal 1 st cousins have ADD/ADHD    Social History   Tobacco Use   Smoking status: Former    Types: Cigars    Quit date: 02/28/2023    Years since quitting: 0.5   Smokeless tobacco: Never   Tobacco comments:    outside smokers at home  Vaping Use   Vaping status: Never Used  Substance Use Topics   Alcohol use: No    Alcohol/week: 0.0 standard drinks of alcohol   Drug use: Not Currently    Types: Marijuana    Comment: smokes  marijuana daily     Allergies: No Known Allergies  Medications Prior to Admission  Medication Sig Dispense Refill Last Dose/Taking   Prenatal Vit-Fe Fumarate-FA (PRENATAL MULTIVITAMIN) TABS tablet Take 1 tablet by mouth daily at 12 noon.   Taking   valACYclovir (VALTREX) 1000 MG tablet Take 1,000 mg by mouth daily.   09/13/2023   hydrOXYzine (ATARAX) 10 MG tablet Take 10-20 mg by mouth at bedtime. Pt not taking. No needed      oseltamivir (TAMIFLU) 75 MG capsule Take 1 capsule (75 mg total) by mouth every 12 (twelve) hours. (Patient not taking: Reported on 09/13/2023) 10 capsule 0 Not Taking    Review of Systems  Constitutional:  Negative for chills, fatigue and fever.  Eyes:  Negative for pain and visual disturbance.  Respiratory:  Negative for apnea, shortness of breath and wheezing.   Cardiovascular:  Negative for chest pain and palpitations.  Gastrointestinal:  Negative for abdominal pain, constipation, diarrhea, nausea and vomiting.  Genitourinary:  Positive for pelvic pain. Negative for difficulty urinating, dysuria, vaginal  bleeding, vaginal discharge and vaginal pain.  Musculoskeletal:  Negative for back pain.  Neurological:  Negative for seizures, weakness and headaches.  Psychiatric/Behavioral:  Negative for suicidal ideas.    Physical Exam   Blood pressure 110/68, pulse 94, temperature 98.4 F (36.9 C), temperature source Oral, resp. rate 17, height 5' (1.524 m), weight 100.7 kg, last menstrual period 01/02/2023, SpO2 99%.  Physical Exam Vitals and nursing note reviewed.  Constitutional:      General: She is not in acute distress.    Appearance: Normal appearance.  HENT:     Head: Normocephalic.  Pulmonary:     Effort: Pulmonary effort is normal.  Abdominal:     Palpations: Abdomen is soft.     Tenderness: There is no abdominal tenderness.     Comments: Gravis uterus   Musculoskeletal:     Cervical back: Normal range of motion.  Skin:    General: Skin is warm and dry.  Neurological:     Mental Status: She is alert and oriented to person, place, and time.  Psychiatric:        Mood and Affect: Mood normal.    FHT: 150bpm with moderate variability. Accels present no decels noted.  Toco: UI noted.   MAU Course  Procedures Orders Placed This Encounter  Procedures   [place] MFM FETAL BPP WO NON STRESS   Meds ordered this encounter  Medications   acetaminophen (TYLENOL) tablet 1,000 mg   cyclobenzaprine (FLEXERIL) tablet 10 mg   cyclobenzaprine (FLEXERIL) 10 MG tablet    Sig: Take 1 tablet (10 mg total) by mouth 3 (three) times daily as needed for muscle spasms.    Dispense:  30 tablet    Refill:  1    Supervising Provider:   Samara Snide     MDM - Plan for 4 hours of monitoring and a [place] to potentially r/o abruption.  -P.o. Tylenol and Flexeril given for discomfort. -BPP 8 out of 8 -NST reactive and reassuring for 4 hours and MAU. -Patient reports that he agrees and frequent fetal movement while in the MAU -Plan for discharge Assessment and Plan   1. Fall, sequela   2. [redacted]  weeks gestation of pregnancy   3. Pelvic pain    -Reviewed expectations for soreness and discomfort over the next few days.   -Reviewed worsening signs and return precautions. -Fetal heart tracing appropriate for gestational age upon time of  discharge. -Patient discharged home in stable condition and may return to MAU as needed    Claudette Head, MSN CNM  09/13/2023, 3:00 PM

## 2023-09-16 ENCOUNTER — Inpatient Hospital Stay (HOSPITAL_COMMUNITY)
Admission: AD | Admit: 2023-09-16 | Discharge: 2023-09-16 | Disposition: A | Discharge status: Home / Self Care | Attending: Obstetrics and Gynecology | Admitting: Obstetrics and Gynecology

## 2023-09-16 DIAGNOSIS — O99283 Endocrine, nutritional and metabolic diseases complicating pregnancy, third trimester: Secondary | ICD-10-CM | POA: Insufficient documentation

## 2023-09-16 DIAGNOSIS — O26893 Other specified pregnancy related conditions, third trimester: Secondary | ICD-10-CM | POA: Insufficient documentation

## 2023-09-16 DIAGNOSIS — O99019 Anemia complicating pregnancy, unspecified trimester: Secondary | ICD-10-CM

## 2023-09-16 DIAGNOSIS — O99013 Anemia complicating pregnancy, third trimester: Secondary | ICD-10-CM | POA: Diagnosis not present

## 2023-09-16 DIAGNOSIS — E876 Hypokalemia: Secondary | ICD-10-CM | POA: Diagnosis not present

## 2023-09-16 DIAGNOSIS — R42 Dizziness and giddiness: Secondary | ICD-10-CM | POA: Insufficient documentation

## 2023-09-16 DIAGNOSIS — Z3A37 37 weeks gestation of pregnancy: Secondary | ICD-10-CM

## 2023-09-16 LAB — CBC WITH DIFFERENTIAL/PLATELET
Abs Immature Granulocytes: 0.08 10*3/uL — ABNORMAL HIGH (ref 0.00–0.07)
Basophils Absolute: 0 10*3/uL (ref 0.0–0.1)
Basophils Relative: 0 %
Eosinophils Absolute: 0.2 10*3/uL (ref 0.0–0.5)
Eosinophils Relative: 1 %
HCT: 28.8 % — ABNORMAL LOW (ref 36.0–46.0)
Hemoglobin: 10.2 g/dL — ABNORMAL LOW (ref 12.0–15.0)
Immature Granulocytes: 1 %
Lymphocytes Relative: 13 %
Lymphs Abs: 1.3 10*3/uL (ref 0.7–4.0)
MCH: 30.7 pg (ref 26.0–34.0)
MCHC: 35.4 g/dL (ref 30.0–36.0)
MCV: 86.7 fL (ref 80.0–100.0)
Monocytes Absolute: 0.6 10*3/uL (ref 0.1–1.0)
Monocytes Relative: 6 %
Neutro Abs: 8.5 10*3/uL — ABNORMAL HIGH (ref 1.7–7.7)
Neutrophils Relative %: 79 %
Platelets: 166 10*3/uL (ref 150–400)
RBC: 3.32 MIL/uL — ABNORMAL LOW (ref 3.87–5.11)
RDW: 14.4 % (ref 11.5–15.5)
WBC: 10.7 10*3/uL — ABNORMAL HIGH (ref 4.0–10.5)
nRBC: 0 % (ref 0.0–0.2)

## 2023-09-16 LAB — URINALYSIS, ROUTINE W REFLEX MICROSCOPIC
Bilirubin Urine: NEGATIVE
Glucose, UA: NEGATIVE mg/dL
Ketones, ur: NEGATIVE mg/dL
Leukocytes,Ua: NEGATIVE
Nitrite: NEGATIVE
Protein, ur: NEGATIVE mg/dL
Specific Gravity, Urine: 1.013 (ref 1.005–1.030)
pH: 7 (ref 5.0–8.0)

## 2023-09-16 LAB — COMPREHENSIVE METABOLIC PANEL
ALT: 11 U/L (ref 0–44)
AST: 17 U/L (ref 15–41)
Albumin: 2.5 g/dL — ABNORMAL LOW (ref 3.5–5.0)
Alkaline Phosphatase: 115 U/L (ref 38–126)
Anion gap: 10 (ref 5–15)
BUN: <5 mg/dL — ABNORMAL LOW (ref 6–20)
CO2: 19 mmol/L — ABNORMAL LOW (ref 22–32)
Calcium: 8.5 mg/dL — ABNORMAL LOW (ref 8.9–10.3)
Chloride: 106 mmol/L (ref 98–111)
Creatinine, Ser: 0.56 mg/dL (ref 0.44–1.00)
GFR, Estimated: >60 mL/min (ref >60)
Glucose, Bld: 133 mg/dL — ABNORMAL HIGH (ref 70–99)
Potassium: 3.1 mmol/L — ABNORMAL LOW (ref 3.5–5.1)
Sodium: 135 mmol/L (ref 135–145)
Total Bilirubin: 0.5 mg/dL (ref 0.0–1.2)
Total Protein: 6.1 g/dL — ABNORMAL LOW (ref 6.5–8.1)

## 2023-09-16 LAB — MAGNESIUM: Magnesium: 1.6 mg/dL — ABNORMAL LOW (ref 1.7–2.4)

## 2023-09-16 LAB — GLUCOSE, CAPILLARY: Glucose-Capillary: 135 mg/dL — ABNORMAL HIGH (ref 70–99)

## 2023-09-16 MED ORDER — POTASSIUM CHLORIDE CRYS ER 20 MEQ PO TBCR
40.0000 meq | EXTENDED_RELEASE_TABLET | Freq: Two times a day (BID) | ORAL | Status: DC
Start: 2023-09-16 — End: 2023-09-16
  Administered 2023-09-16: 40 meq via ORAL
  Filled 2023-09-16: qty 2

## 2023-09-16 MED ORDER — IRON 325 (65 FE) MG PO TABS: 1.0000 | ORAL_TABLET | ORAL | 0 refills | Status: AC

## 2023-09-16 MED ORDER — ACETAMINOPHEN 500 MG PO TABS
1000.0000 mg | ORAL_TABLET | Freq: Once | ORAL | Status: AC
  Administered 2023-09-16: 1000 mg via ORAL
  Filled 2023-09-16: qty 2

## 2023-09-16 MED ORDER — POTASSIUM CHLORIDE CRYS ER 20 MEQ PO TBCR: 20.0000 meq | EXTENDED_RELEASE_TABLET | Freq: Every day | ORAL | 0 refills | Status: DC

## 2023-09-16 NOTE — MAU Provider Note (Signed)
 History     CSN: [id number]  Arrival date and time: 09/16/23 1500   Event Date/Time   First Provider Initiated Contact with Patient 09/16/23 1543      No chief complaint on file.  HPI  [name] Is a 28 y.o. female [redacted]w[redacted]d here with dizziness. The symptoms started today.   She reports dizziness all day today. She got up and ate and still felt dizzy following eating.    She ate pancakes and hash browns for breakfast and then went back to sleep for the rest of the day. This is a new symptom, she has not felt this in the past. She has not had a fall. She reports HA, she has not tried anything for the HA. She feels well hydrated. + fetal movement, no bleeding   OB History     Gravida  2   Para  1   Term  1   Preterm      AB      Living  1      SAB      IAB      Ectopic      Multiple  0   Live Births  1           Past Medical History:  Diagnosis Date   ADHD (attention deficit hyperactivity disorder)    Anemia    Anxiety    Constipation    Depression    GERD (gastroesophageal reflux disease)    OTC as needed   Gestational diabetes    Hypertension    Hypertrophy of tonsils and adenoids 08/2013   snores during sleep; had sleep study 03/2013:  AHI 1.9, RDI 2.3   Irregular menses    Nexplanon implant   Learning disorder involving mathematics    Migraines     Past Surgical History:  Procedure Laterality Date   CESAREAN SECTION N/A 01/18/2016   Procedure: CESAREAN SECTION;  Surgeon: Myna Hidalgo, DO;  [facility]: [facility];  Service: Obstetrics;  Laterality: N/A;   TONSILLECTOMY AND ADENOIDECTOMY Bilateral 09/25/2013   Procedure: BILATERAL TONSILLECTOMY AND ADENOIDECTOMY;  Surgeon: Darletta Moll, MD;  Location: [facility];  Service: ENT;  Laterality: Bilateral;   WISDOM TOOTH EXTRACTION  2015    Family History  Problem Relation Age of Onset   Asthma Mother    Hypertension Mother    Anesthesia problems Mother        hard  to wake up post-op   Migraines Mother    Depression Mother    Anxiety disorder Mother    Heart disease Maternal Grandmother    Migraines Maternal Grandmother    Bipolar disorder Maternal Aunt    Schizophrenia Maternal Aunt    Depression Maternal Aunt    ADD / ADHD Cousin        Many paternal 1 st cousins have ADD/ADHD    Social History   Tobacco Use   Smoking status: Former    Types: Cigars    Quit date: 02/28/2023    Years since quitting: 0.5   Smokeless tobacco: Never   Tobacco comments:    outside smokers at home  Vaping Use   Vaping status: Never Used  Substance Use Topics   Alcohol use: No    Alcohol/week: 0.0 standard drinks of alcohol   Drug use: Not Currently    Types: Marijuana    Comment: smokes marijuana daily     Allergies: No Known Allergies  Medications Prior to Admission  Medication Sig Dispense Refill Last Dose/Taking   cyclobenzaprine (FLEXERIL) 10 MG tablet Take 1 tablet (10 mg total) by mouth 3 (three) times daily as needed for muscle spasms. 30 tablet 1    hydrOXYzine (ATARAX) 10 MG tablet Take 10-20 mg by mouth at bedtime. Pt not taking. No needed      oseltamivir (TAMIFLU) 75 MG capsule Take 1 capsule (75 mg total) by mouth every 12 (twelve) hours. (Patient not taking: Reported on 09/13/2023) 10 capsule 0    Prenatal Vit-Fe Fumarate-FA (PRENATAL MULTIVITAMIN) TABS tablet Take 1 tablet by mouth daily at 12 noon.      valACYclovir (VALTREX) 1000 MG tablet Take 1,000 mg by mouth daily.      Results for orders placed or performed during the hospital encounter of 09/16/23 (from the past 24 hours)  CBC with Differential/Platelet     Status: Abnormal   Collection Time: 09/16/23  3:52 PM  Result Value Ref Range   WBC 10.7 (H) 4.0 - 10.5 K/uL   RBC 3.32 (L) 3.87 - 5.11 MIL/uL   Hemoglobin 10.2 (L) 12.0 - 15.0 g/dL   HCT 08.6 (L) 57.8 - 46.9 %   MCV 86.7 80.0 - 100.0 fL   MCH 30.7 26.0 - 34.0 pg   MCHC 35.4 30.0 - 36.0 g/dL   RDW 62.9 52.8 - 41.3 %    Platelets 166 150 - 400 K/uL   nRBC 0.0 0.0 - 0.2 %   Neutrophils Relative % 79 %   Neutro Abs 8.5 (H) 1.7 - 7.7 K/uL   Lymphocytes Relative 13 %   Lymphs Abs 1.3 0.7 - 4.0 K/uL   Monocytes Relative 6 %   Monocytes Absolute 0.6 0.1 - 1.0 K/uL   Eosinophils Relative 1 %   Eosinophils Absolute 0.2 0.0 - 0.5 K/uL   Basophils Relative 0 %   Basophils Absolute 0.0 0.0 - 0.1 K/uL   Immature Granulocytes 1 %   Abs Immature Granulocytes 0.08 (H) 0.00 - 0.07 K/uL  Glucose, capillary     Status: Abnormal   Collection Time: 09/16/23  3:56 PM  Result Value Ref Range   Glucose-Capillary 135 (H) 70 - 99 mg/dL  Urinalysis, Routine w reflex microscopic -Urine, Clean Catch     Status: Abnormal   Collection Time: 09/16/23  4:00 PM  Result Value Ref Range   Color, Urine YELLOW YELLOW   APPearance HAZY (A) CLEAR   Specific Gravity, Urine 1.013 1.005 - 1.030   pH 7.0 5.0 - 8.0   Glucose, UA NEGATIVE NEGATIVE mg/dL   Hgb urine dipstick SMALL (A) NEGATIVE   Bilirubin Urine NEGATIVE NEGATIVE   Ketones, ur NEGATIVE NEGATIVE mg/dL   Protein, ur NEGATIVE NEGATIVE mg/dL   Nitrite NEGATIVE NEGATIVE   Leukocytes,Ua NEGATIVE NEGATIVE   RBC / HPF 11-20 0 - 5 RBC/hpf   WBC, UA 0-5 0 - 5 WBC/hpf   Bacteria, UA MANY (A) NONE SEEN   Squamous Epithelial / HPF 0-5 0 - 5 /HPF   Mucus PRESENT      Review of Systems  Neurological:  Positive for dizziness and headaches.   Physical Exam   Blood pressure 120/66, pulse 83, temperature 99.2 F (37.3 C), temperature source Oral, resp. rate 16, last menstrual period 01/02/2023, SpO2 100%.  Patient Vitals for the past 24 hrs:  BP Temp Temp src Pulse Resp SpO2  09/16/23 1604 112/68 -- -- 87 -- --  09/16/23 1601 (!) 104/59 -- -- (!) 101 -- --  09/16/23 1559  121/74 -- -- 80 -- --  09/16/23 1558 117/63 -- -- 79 -- 99 %  09/16/23 1525 120/66 99.2 F (37.3 C) Oral 83 16 100 %     Physical Exam Constitutional:      General: She is not in acute distress.     Appearance: She is well-developed. She is not ill-appearing, toxic-appearing or diaphoretic.  HENT:     Head: Normocephalic.  Skin:    General: Skin is warm.  Neurological:     Mental Status: She is alert and oriented to person, place, and time.  Psychiatric:        Behavior: Behavior normal.    Baseline 145 bpm, 15x15 accels, no decels.   MAU Course  Procedures  MDM  Orthostatic vitals show mildly elevated HR with standing, although back to baseline after 3 mins which is normal.  Tylenol 1 gram given, HA down to 0/10 Oral hydration encouraged while in MAU.  She denies barriers to food.  Kdur 40 meq given in MAU, magnesium pending.   Assessment and Plan   A:  1. Antepartum anemia   2. [redacted] weeks gestation of pregnancy   3. Dizziness   4. Hypokalemia      Discharge home Discussed healthy eating/diet including 20 grams of protein with each meal 60-80 ounces of water per day Oral iron every other day with Vit D  Rx: Kdur x 5 days  Venia Carbon I, NP 09/19/2023 1:13 PM

## 2023-09-16 NOTE — MAU Note (Signed)
Notified Main Lab of CMP add on.

## 2023-09-16 NOTE — MAU Note (Addendum)
.  Ashley Romero is a 28 y.o. at [redacted]w[redacted]d here in MAU reporting: Woke up feeling light headed with a HA as well. She reports she has felt light headed throughout the day at random times and her HA has persisted. She has not taken anything for her HA. She reports she has eaten today and has drank water. Denies VB or LOF. +FM.   Last CBC on 1/30. Hgb was 10.1. -Was recently evaluated in MAU on 3/17 for a fall and direct hit to her abdomen. Was monitored for four hours and sent home. -Next OB appointment tomorrow.  Onset of complaint: This morning Pain score: 6/10 HA  Vitals:   09/16/23 1525  BP: 120/66  Pulse: 83  Resp: 16  Temp: 99.2 F (37.3 C)  SpO2: 100%     FHT: 135 initial external Lab orders placed from triage: UA

## 2023-10-05 ENCOUNTER — Other Ambulatory Visit: Payer: Self-pay

## 2023-10-05 ENCOUNTER — Inpatient Hospital Stay (HOSPITAL_COMMUNITY)
Admission: AD | Admit: 2023-10-05 | Discharge: 2023-10-05 | Disposition: A | Discharge status: Home / Self Care | Source: Home / Self Care | Attending: Obstetrics and Gynecology | Admitting: Obstetrics and Gynecology

## 2023-10-05 ENCOUNTER — Other Ambulatory Visit: Payer: Self-pay | Admitting: Obstetrics and Gynecology

## 2023-10-05 ENCOUNTER — Encounter (HOSPITAL_COMMUNITY): Payer: Self-pay | Admitting: Obstetrics and Gynecology

## 2023-10-05 DIAGNOSIS — O480 Post-term pregnancy: Secondary | ICD-10-CM | POA: Insufficient documentation

## 2023-10-05 DIAGNOSIS — Z3A40 40 weeks gestation of pregnancy: Secondary | ICD-10-CM

## 2023-10-05 DIAGNOSIS — O471 False labor at or after 37 completed weeks of gestation: Secondary | ICD-10-CM | POA: Insufficient documentation

## 2023-10-05 DIAGNOSIS — O479 False labor, unspecified: Secondary | ICD-10-CM

## 2023-10-05 DIAGNOSIS — O34211 Maternal care for low transverse scar from previous cesarean delivery: Secondary | ICD-10-CM | POA: Diagnosis not present

## 2023-10-05 DIAGNOSIS — O26893 Other specified pregnancy related conditions, third trimester: Secondary | ICD-10-CM | POA: Diagnosis not present

## 2023-10-05 NOTE — MAU Note (Signed)
.  Ashley Romero is a 28 y.o. at [redacted]w[redacted]d here in MAU reporting: ctx for the past 2 hours - every 10-15 minutes. Denies VB or LOF. +FM.   Onset of complaint: 2300 Pain score: 7 Vitals:   10/05/23 0119  BP: 126/81  Pulse: 75  Resp: 18  Temp: 98.5 F (36.9 C)  SpO2: 100%     FHT: 152  Lab orders placed from triage: labor eval

## 2023-10-05 NOTE — MAU Provider Note (Signed)
 None      S: [name] is a 28 y.o. G2P1001 at [redacted]w[redacted]d  who presents to MAU today complaining contractions q 10-15 minutes  She denies vaginal bleeding. She denies LOF. She reports normal fetal movement.    O: BP 95/61   Pulse 79   Temp 98.5 F (36.9 C)   Resp 18   Ht 5' (1.524 m)   Wt 106.1 kg   LMP 01/02/2023 (Exact Date)   SpO2 100%   BMI 45.68 kg/m  GENERAL: Well-developed, well-nourished female in no acute distress.  HEAD: Normocephalic, atraumatic.  CHEST: Normal effort of breathing, regular heart rate ABDOMEN: Soft, nontender, gravid  Cervical exam:  Dilation: Fingertip Effacement (%): Thick Cervical Position: Middle Station: -1 Exam by:: Henrine Screws, RN   Fetal Monitoring: Baseline: 140 Variability: average Accelerations: present Decelerations: absent Contractions: occasional    A: [facility] at [redacted]w[redacted]d  False labor  P: DIscharge home Offered observation and recheck, declines Labor precautions Encouraged to return if she develops worsening of symptoms, increase in pain, fever, or other concerning symptoms.     Aviva Signs, CNM 10/05/2023 2:07 AM

## 2023-10-07 ENCOUNTER — Other Ambulatory Visit: Payer: Self-pay | Admitting: Obstetrics and Gynecology

## 2023-10-08 ENCOUNTER — Inpatient Hospital Stay (HOSPITAL_COMMUNITY): Admitting: Anesthesiology

## 2023-10-08 ENCOUNTER — Inpatient Hospital Stay (HOSPITAL_COMMUNITY)
Admission: RE | Admit: 2023-10-08 | Discharge: 2023-10-11 | DRG: 806 | Disposition: A | Discharge status: Home / Self Care | Attending: Family Medicine | Admitting: Family Medicine

## 2023-10-08 ENCOUNTER — Encounter (HOSPITAL_COMMUNITY): Payer: Self-pay | Admitting: Obstetrics and Gynecology

## 2023-10-08 ENCOUNTER — Other Ambulatory Visit: Payer: Self-pay

## 2023-10-08 DIAGNOSIS — O9962 Diseases of the digestive system complicating childbirth: Secondary | ICD-10-CM | POA: Diagnosis present

## 2023-10-08 DIAGNOSIS — O99824 Streptococcus B carrier state complicating childbirth: Secondary | ICD-10-CM | POA: Diagnosis present

## 2023-10-08 DIAGNOSIS — Z87891 Personal history of nicotine dependence: Secondary | ICD-10-CM | POA: Diagnosis not present

## 2023-10-08 DIAGNOSIS — E66813 Obesity, class 3: Secondary | ICD-10-CM | POA: Diagnosis present

## 2023-10-08 DIAGNOSIS — O34211 Maternal care for low transverse scar from previous cesarean delivery: Secondary | ICD-10-CM | POA: Diagnosis present

## 2023-10-08 DIAGNOSIS — O99214 Obesity complicating childbirth: Secondary | ICD-10-CM | POA: Diagnosis present

## 2023-10-08 DIAGNOSIS — O9902 Anemia complicating childbirth: Secondary | ICD-10-CM | POA: Diagnosis present

## 2023-10-08 DIAGNOSIS — R8271 Bacteriuria: Secondary | ICD-10-CM | POA: Insufficient documentation

## 2023-10-08 DIAGNOSIS — Z3A40 40 weeks gestation of pregnancy: Secondary | ICD-10-CM

## 2023-10-08 DIAGNOSIS — D509 Iron deficiency anemia, unspecified: Secondary | ICD-10-CM | POA: Diagnosis present

## 2023-10-08 DIAGNOSIS — K219 Gastro-esophageal reflux disease without esophagitis: Secondary | ICD-10-CM | POA: Diagnosis present

## 2023-10-08 DIAGNOSIS — O9832 Other infections with a predominantly sexual mode of transmission complicating childbirth: Secondary | ICD-10-CM | POA: Diagnosis present

## 2023-10-08 DIAGNOSIS — O26893 Other specified pregnancy related conditions, third trimester: Secondary | ICD-10-CM | POA: Diagnosis present

## 2023-10-08 DIAGNOSIS — A6000 Herpesviral infection of urogenital system, unspecified: Secondary | ICD-10-CM | POA: Diagnosis present

## 2023-10-08 DIAGNOSIS — Z3490 Encounter for supervision of normal pregnancy, unspecified, unspecified trimester: Principal | ICD-10-CM | POA: Diagnosis present

## 2023-10-08 DIAGNOSIS — Z8249 Family history of ischemic heart disease and other diseases of the circulatory system: Secondary | ICD-10-CM | POA: Diagnosis not present

## 2023-10-08 DIAGNOSIS — O34219 Maternal care for unspecified type scar from previous cesarean delivery: Secondary | ICD-10-CM | POA: Diagnosis not present

## 2023-10-08 LAB — CBC
HCT: 30.9 % — ABNORMAL LOW (ref 36.0–46.0)
Hemoglobin: 10.6 g/dL — ABNORMAL LOW (ref 12.0–15.0)
MCH: 29.7 pg (ref 26.0–34.0)
MCHC: 34.3 g/dL (ref 30.0–36.0)
MCV: 86.6 fL (ref 80.0–100.0)
Platelets: 188 10*3/uL (ref 150–400)
RBC: 3.57 MIL/uL — ABNORMAL LOW (ref 3.87–5.11)
RDW: 14.5 % (ref 11.5–15.5)
WBC: 11.2 10*3/uL — ABNORMAL HIGH (ref 4.0–10.5)
nRBC: 0 % (ref 0.0–0.2)

## 2023-10-08 LAB — TYPE AND SCREEN
ABO/RH(D): O POS
Antibody Screen: NEGATIVE

## 2023-10-08 LAB — RPR: RPR Ser Ql: NONREACTIVE

## 2023-10-08 MED ORDER — LACTATED RINGERS IV SOLN: INTRAVENOUS | Status: DC

## 2023-10-08 MED ORDER — DIPHENHYDRAMINE HCL 50 MG/ML IJ SOLN: 12.5000 mg | INTRAMUSCULAR | Status: DC | PRN

## 2023-10-08 MED ORDER — OXYTOCIN-SODIUM CHLORIDE 30-0.9 UT/500ML-% IV SOLN
2.5000 [IU]/h | INTRAVENOUS | Status: DC
  Administered 2023-10-09: 2.5 [IU]/h via INTRAVENOUS
  Filled 2023-10-08 (×2): qty 500

## 2023-10-08 MED ORDER — SODIUM CHLORIDE 0.9 % IV SOLN
5.0000 10*6.[IU] | Freq: Once | INTRAVENOUS | Status: AC
  Administered 2023-10-08: 5 10*6.[IU] via INTRAVENOUS
  Filled 2023-10-08: qty 5

## 2023-10-08 MED ORDER — PENICILLIN G POT IN DEXTROSE 60000 UNIT/ML IV SOLN
3.0000 10*6.[IU] | INTRAVENOUS | Status: DC
  Administered 2023-10-08 – 2023-10-09 (×5): 3 10*6.[IU] via INTRAVENOUS
  Filled 2023-10-08 (×5): qty 50

## 2023-10-08 MED ORDER — OXYTOCIN-SODIUM CHLORIDE 30-0.9 UT/500ML-% IV SOLN
1.0000 m[IU]/min | INTRAVENOUS | Status: DC
  Administered 2023-10-08: 2 m[IU]/min via INTRAVENOUS

## 2023-10-08 MED ORDER — ONDANSETRON HCL 4 MG/2ML IJ SOLN
4.0000 mg | Freq: Four times a day (QID) | INTRAMUSCULAR | Status: DC | PRN
  Administered 2023-10-09: 4 mg via INTRAVENOUS
  Filled 2023-10-08: qty 2

## 2023-10-08 MED ORDER — TERBUTALINE SULFATE 1 MG/ML IJ SOLN: 0.2500 mg | Freq: Once | INTRAMUSCULAR | Status: DC | PRN

## 2023-10-08 MED ORDER — SOD CITRATE-CITRIC ACID 500-334 MG/5ML PO SOLN: 30.0000 mL | ORAL | Status: DC | PRN

## 2023-10-08 MED ORDER — EPHEDRINE 5 MG/ML INJ: 10.0000 mg | INTRAVENOUS | Status: DC | PRN

## 2023-10-08 MED ORDER — PHENYLEPHRINE 80 MCG/ML (10ML) SYRINGE FOR IV PUSH (FOR BLOOD PRESSURE SUPPORT): 80.0000 ug | PREFILLED_SYRINGE | INTRAVENOUS | Status: DC | PRN

## 2023-10-08 MED ORDER — LIDOCAINE HCL (PF) 1 % IJ SOLN: 30.0000 mL | INTRAMUSCULAR | Status: DC | PRN

## 2023-10-08 MED ORDER — FENTANYL-BUPIVACAINE-NACL 0.5-0.125-0.9 MG/250ML-% EP SOLN
EPIDURAL | Status: AC
  Filled 2023-10-08: qty 250

## 2023-10-08 MED ORDER — OXYTOCIN BOLUS FROM INFUSION
333.0000 mL | Freq: Once | INTRAVENOUS | Status: AC
Start: 2023-10-08 — End: 2023-10-09
  Administered 2023-10-09: 333 mL via INTRAVENOUS

## 2023-10-08 MED ORDER — ACETAMINOPHEN 325 MG PO TABS
650.0000 mg | ORAL_TABLET | ORAL | Status: DC | PRN
  Administered 2023-10-08 – 2023-10-09 (×2): 650 mg via ORAL
  Filled 2023-10-08 (×2): qty 2

## 2023-10-08 MED ORDER — LACTATED RINGERS IV SOLN: 500.0000 mL | INTRAVENOUS | Status: DC | PRN

## 2023-10-08 MED ORDER — FENTANYL-BUPIVACAINE-NACL 0.5-0.125-0.9 MG/250ML-% EP SOLN
12.0000 mL/h | EPIDURAL | Status: DC | PRN
  Administered 2023-10-09: 12 mL/h via EPIDURAL

## 2023-10-08 MED ORDER — FENTANYL CITRATE (PF) 100 MCG/2ML IJ SOLN
50.0000 ug | INTRAMUSCULAR | Status: DC | PRN
Start: 2023-10-08 — End: 2023-10-09
  Administered 2023-10-08: 50 ug via INTRAVENOUS
  Administered 2023-10-08: 100 ug via INTRAVENOUS
  Filled 2023-10-08 (×2): qty 2

## 2023-10-08 MED ORDER — PHENYLEPHRINE 80 MCG/ML (10ML) SYRINGE FOR IV PUSH (FOR BLOOD PRESSURE SUPPORT)
80.0000 ug | PREFILLED_SYRINGE | INTRAVENOUS | Status: DC | PRN
  Administered 2023-10-09: 80 ug via INTRAVENOUS
  Filled 2023-10-08: qty 10

## 2023-10-08 MED ORDER — LACTATED RINGERS IV SOLN: 500.0000 mL | Freq: Once | INTRAVENOUS | Status: DC

## 2023-10-08 MED ORDER — FLEET ENEMA RE ENEM: 1.0000 | ENEMA | RECTAL | Status: DC | PRN

## 2023-10-08 NOTE — Progress Notes (Signed)
 Subjective:    Starting to feel mild cramping. Pain management options discussed.   Objective:    VS: BP (!) 103/57   Pulse 83   Temp 98.1 F (36.7 C) (Oral)   Resp 16   Ht 5' (1.524 m)   Wt 106.6 kg   LMP 01/02/2023 (Exact Date)   BMI 45.91 kg/m  FHR : baseline 140 / variability moderate / accelerations present / absent decelerations Toco: contractions are irregular Membranes: intact Dilation: Closed Effacement (%): 50 Station: Ballotable Presentation: Vertex Exam by:: [place] CNM Pitocin 6 mU/min  Assessment/Plan:   28 y.o. G2P1001 [redacted]w[redacted]d  Labor:  cervical ripening with Pitocin, unable to insert Cook ballon Fetal Wellbeing:  Category I Pain Control:   none at this time, considering IV sedation and epidural I/D:   GBS pos, PCN prophylaxis Anticipated MOD:  NSVD  Roma Schanz DNP, CNM 10/08/2023 5:50 PM
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
## 2023-10-08 NOTE — Anesthesia Preprocedure Evaluation (Signed)
 Anesthesia Evaluation  Patient identified by MRN, date of birth, ID band Patient awake    Reviewed: Allergy & Precautions, NPO status , Patient's Chart, lab work & pertinent test results  Airway Mallampati: III  TM Distance: >3 FB Neck ROM: Full    Dental no notable dental hx.    Pulmonary sleep apnea , former smoker   Pulmonary exam normal breath sounds clear to auscultation       Cardiovascular hypertension, negative cardio ROS Normal cardiovascular exam Rhythm:Regular Rate:Normal     Neuro/Psych  Headaches PSYCHIATRIC DISORDERS Anxiety Depression Bipolar Disorder      GI/Hepatic Neg liver ROS,GERD  ,,  Endo/Other  diabetes  Class 3 obesity (BMI 46)  Renal/GU negative Renal ROS  negative genitourinary   Musculoskeletal negative musculoskeletal ROS (+)    Abdominal   Peds  (+) ADHD Hematology negative hematology ROS (+)   Anesthesia Other Findings TOLAC, elective IOL  Reproductive/Obstetrics (+) Pregnancy                             Anesthesia Physical Anesthesia Plan  ASA: 3  Anesthesia Plan: Epidural   Post-op Pain Management:    Induction:   PONV Risk Score and Plan: Treatment may vary due to age or medical condition  Airway Management Planned: Natural Airway  Additional Equipment:   Intra-op Plan:   Post-operative Plan:   Informed Consent: I have reviewed the patients History and Physical, chart, labs and discussed the procedure including the risks, benefits and alternatives for the proposed anesthesia with the patient or authorized representative who has indicated his/her understanding and acceptance.       Plan Discussed with: Anesthesiologist  Anesthesia Plan Comments: (Patient identified. Risks, benefits, options discussed with patient including but not limited to bleeding, infection, nerve damage, paralysis, failed block, incomplete pain control, headache,  blood pressure changes, nausea, vomiting, reactions to medication, itching, and post partum back pain. Confirmed with bedside nurse the patient's most recent platelet count. Confirmed with the patient that they are not taking any anticoagulation, have any bleeding history or any family history of bleeding disorders. Patient expressed understanding and wishes to proceed. All questions were answered. )       Anesthesia Quick Evaluation

## 2023-10-09 ENCOUNTER — Encounter (HOSPITAL_COMMUNITY): Payer: Self-pay | Admitting: Family Medicine

## 2023-10-09 MED ORDER — TETANUS-DIPHTH-ACELL PERTUSSIS 5-2.5-18.5 LF-MCG/0.5 IM SUSY: 0.5000 mL | PREFILLED_SYRINGE | Freq: Once | INTRAMUSCULAR | Status: DC

## 2023-10-09 MED ORDER — PRENATAL MULTIVITAMIN CH
1.0000 | ORAL_TABLET | Freq: Every day | ORAL | Status: DC
  Administered 2023-10-09 – 2023-10-11 (×3): 1 via ORAL
  Filled 2023-10-09 (×3): qty 1

## 2023-10-09 MED ORDER — ONDANSETRON HCL 4 MG/2ML IJ SOLN: 4.0000 mg | INTRAMUSCULAR | Status: DC | PRN

## 2023-10-09 MED ORDER — WITCH HAZEL-GLYCERIN EX PADS: 1.0000 | MEDICATED_PAD | CUTANEOUS | Status: DC | PRN

## 2023-10-09 MED ORDER — ZOLPIDEM TARTRATE 5 MG PO TABS: 5.0000 mg | ORAL_TABLET | Freq: Every evening | ORAL | Status: DC | PRN

## 2023-10-09 MED ORDER — SENNOSIDES-DOCUSATE SODIUM 8.6-50 MG PO TABS
2.0000 | ORAL_TABLET | Freq: Every day | ORAL | Status: DC
  Administered 2023-10-10 – 2023-10-11 (×2): 2 via ORAL
  Filled 2023-10-09 (×2): qty 2

## 2023-10-09 MED ORDER — ACETAMINOPHEN 325 MG PO TABS
650.0000 mg | ORAL_TABLET | ORAL | Status: DC | PRN
  Administered 2023-10-09 – 2023-10-11 (×3): 650 mg via ORAL
  Filled 2023-10-09 (×3): qty 2

## 2023-10-09 MED ORDER — DIPHENHYDRAMINE HCL 25 MG PO CAPS: 25.0000 mg | ORAL_CAPSULE | Freq: Four times a day (QID) | ORAL | Status: DC | PRN

## 2023-10-09 MED ORDER — OXYCODONE HCL 5 MG PO TABS
5.0000 mg | ORAL_TABLET | ORAL | Status: DC | PRN
  Administered 2023-10-09 – 2023-10-10 (×3): 5 mg via ORAL
  Filled 2023-10-09 (×3): qty 1

## 2023-10-09 MED ORDER — SIMETHICONE 80 MG PO CHEW: 80.0000 mg | CHEWABLE_TABLET | ORAL | Status: DC | PRN

## 2023-10-09 MED ORDER — DIBUCAINE (PERIANAL) 1 % EX OINT: 1.0000 | TOPICAL_OINTMENT | CUTANEOUS | Status: DC | PRN

## 2023-10-09 MED ORDER — LIDOCAINE HCL (PF) 1 % IJ SOLN
INTRAMUSCULAR | Status: DC | PRN
  Administered 2023-10-09: 10 mL via EPIDURAL

## 2023-10-09 MED ORDER — IBUPROFEN 600 MG PO TABS
600.0000 mg | ORAL_TABLET | Freq: Four times a day (QID) | ORAL | Status: DC
  Administered 2023-10-09 – 2023-10-11 (×9): 600 mg via ORAL
  Filled 2023-10-09 (×9): qty 1

## 2023-10-09 MED ORDER — COCONUT OIL OIL: 1.0000 | TOPICAL_OIL | Status: DC | PRN

## 2023-10-09 MED ORDER — ONDANSETRON HCL 4 MG PO TABS: 4.0000 mg | ORAL_TABLET | ORAL | Status: DC | PRN

## 2023-10-09 MED ORDER — LACTATED RINGERS AMNIOINFUSION: INTRAVENOUS | Status: DC

## 2023-10-09 MED ORDER — BENZOCAINE-MENTHOL 20-0.5 % EX AERO
1.0000 | INHALATION_SPRAY | CUTANEOUS | Status: DC | PRN
  Filled 2023-10-09: qty 56

## 2023-10-09 NOTE — Anesthesia Postprocedure Evaluation (Signed)
 Anesthesia Post Note  Patient: Ashley Romero  Procedure(s) Performed: AN AD HOC LABOR EPIDURAL     Patient location during evaluation: Mother Baby Anesthesia Type: Epidural Level of consciousness: awake and alert Pain management: pain level controlled Vital Signs Assessment: post-procedure vital signs reviewed and stable Respiratory status: spontaneous breathing, nonlabored ventilation and respiratory function stable Cardiovascular status: stable Postop Assessment: no headache, no backache and epidural receding Anesthetic complications: no   No notable events documented.  Last Vitals:  Vitals:   10/09/23 1226 10/09/23 1336  BP: 126/75 (!) 124/58  Pulse: 73 69  Resp: 18 18  Temp: 36.9 C 37.1 C  SpO2: 100% 100%    Last Pain:  Vitals:   10/09/23 1514  TempSrc:   PainSc: 7    Pain Goal:                   [name]

## 2023-10-09 NOTE — Lactation Note (Signed)
 This note was copied from a baby's chart. Lactation Consultation Note  Patient Name: Ashley Romero Date: 10/09/2023 Age:26 hours Reason for consult: Initial assessment;Term  Ashley Romero student entered the room and provided introduction. Per MOB,  LC Student that  her feeding goals were to breast feed infant but MOB  has mostly been using formula until her milk comes in. MOB states that she does have previous breastfeeding experience and reports breastfeeding older daughter for four months.   MOB advised she also completed prenatal lactation education classes as well. Education provided to Ashley Romero and Ashley Romero on feedings every 2-3 hours, hunger cues, latching, positioning and the importance of maintaining breast stimulation. LC student assisted MOB with latching baby on left breast in a football hold position. Infant fell asleep at the breast twice and was stimulated, infant breastfeed for 5 minutes. Per MOB, infant received over 20 mls of formula early today and may be still full. Education on feeding amounts were provided to MOB and Ashley Romero.   MOB requested a DEBP she plans to pump at her discretion to help stimulate and establish her milk supply as she continue to work on infant learning how to breastfeed. Ashley Romero student demonstrated how to use the DEBP, MOB was using the DEBP as LC Student left the room.     Maternal Data Has patient been taught Hand Expression?: Yes Does the patient have breastfeeding experience prior to this delivery?: Yes How long did the patient breastfeed?: MOB breastfed on the left breast for 5 minutes.  Feeding Mother's Current Feeding Choice: Breast Milk and Formula  LATCH Score Latch: Repeated attempts needed to sustain latch, nipple held in mouth throughout feeding, stimulation needed to elicit sucking reflex.  Audible Swallowing: A few with stimulation  Type of Nipple: Everted at rest and after stimulation  Comfort (Breast/Nipple): Soft / non-tender  Hold  (Positioning): Assistance needed to correctly position infant at breast and maintain latch.  LATCH Score: 7   Lactation Tools Discussed/Used Tools: Pump;Flanges Flange Size: 24 Breast pump type: Double-Electric Breast Pump Pump Education: Setup, frequency, and cleaning Reason for Pumping: Mother's discretion  Interventions    Discharge Discharge Education: Warning signs for feeding baby Pump: DEBP  Consult Status Consult Status: Follow-up    Ashley Romero Grace Romero 10/09/2023, 9:39 PM

## 2023-10-09 NOTE — Progress Notes (Signed)
 Patient requested stronger pain medication writer called provider and got an order for oxycodone 5mg  Q4 hours as needed for pain. Will continue to monitor.

## 2023-10-09 NOTE — Anesthesia Procedure Notes (Signed)
 Epidural Patient location during procedure: OB Start time: 10/09/2023 12:05 AM End time: 10/09/2023 12:15 AM  Staffing Anesthesiologist: Grace Laura, MD Performed: anesthesiologist   Preanesthetic Checklist Completed: patient identified, IV checked, risks and benefits discussed, monitors and equipment checked, pre-op evaluation and timeout performed  Epidural Patient position: sitting Prep: DuraPrep and site prepped and draped Patient monitoring: continuous pulse ox, blood pressure, heart rate and cardiac monitor Approach: midline Location: L3-L4 Injection technique: LOR air  Needle:  Needle type: Tuohy  Needle gauge: 17 G Needle length: 9 cm Needle insertion depth: 8 cm Catheter type: closed end flexible Catheter size: 19 Gauge Catheter at skin depth: 14 cm Test dose: negative  Assessment Sensory level: T8 Events: blood not aspirated, no cerebrospinal fluid, injection not painful, no injection resistance, no paresthesia and negative IV test  Additional Notes Patient identified. Risks/Benefits/Options discussed with patient including but not limited to bleeding, infection, nerve damage, paralysis, failed block, incomplete pain control, headache, blood pressure changes, nausea, vomiting, reactions to medication both or allergic, itching and postpartum back pain. Confirmed with bedside nurse the patient's most recent platelet count. Confirmed with patient that they are not currently taking any anticoagulation, have any bleeding history or any family history of bleeding disorders. Patient expressed understanding and wished to proceed. All questions were answered. Sterile technique was used throughout the entire procedure. Please see nursing notes for vital signs. Test dose was given through epidural catheter and negative prior to continuing to dose epidural or start infusion. Warning signs of high block given to the patient including shortness of breath, tingling/numbness in  hands, complete motor block, or any concerning symptoms with instructions to call for help. Patient was given instructions on fall risk and not to get out of bed. All questions and concerns addressed with instructions to call with any issues or inadequate analgesia.  Reason for block:procedure for pain

## 2023-10-09 NOTE — Progress Notes (Signed)
 Subjective:    Cervical ripening with Pitocin continued for  most of the night and Pitocin increased per protocol after pt remained closed. Pt became more uncomfortable, pain is now well-managed with an epidural. Discussed amniotomy and pt agrees.   Objective:    VS: BP 115/69   Pulse 88   Temp 98.1 F (36.7 C) (Oral)   Resp 15   Ht 5' (1.524 m)   Wt 106.6 kg   LMP 01/02/2023 (Exact Date)   SpO2 99%   BMI 45.91 kg/m  FHR : baseline 150 / variability moderate / accelerations present / occasional variable during the night, overall reassuring IUPC: contractions every 2-3 minutes  Membranes: AROM, scant clear fluid Dilation: 5.5 Effacement (%): 80, 90 Station: -1, -2 Presentation: Vertex Exam by:: Marrianne Six, CNM Pitocin 16 mU/min  Assessment/Plan:   28 y.o. G2P1001 [redacted]w[redacted]d Elective IOL at 40+6 wks Hx of LTCS for arrest of decent )max dilation 6 cm)    -desires TOLAC    -pelvic feels adequate  Labor: Progressing normally Fetal Wellbeing:  Category I Pain Control:  Epidural I/D:   GBS pos w/ PCN prophylaxis Anticipated MOD:  NSVD  Anice Kerbs DNP, CNM 10/09/2023 6:09 AM

## 2023-10-10 DIAGNOSIS — D509 Iron deficiency anemia, unspecified: Secondary | ICD-10-CM | POA: Diagnosis present

## 2023-10-10 DIAGNOSIS — O34219 Maternal care for unspecified type scar from previous cesarean delivery: Secondary | ICD-10-CM | POA: Diagnosis not present

## 2023-10-10 MED ORDER — GUAIFENESIN ER 600 MG PO TB12
600.0000 mg | ORAL_TABLET | Freq: Two times a day (BID) | ORAL | Status: DC
  Administered 2023-10-10 – 2023-10-11 (×3): 600 mg via ORAL
  Filled 2023-10-10 (×3): qty 1

## 2023-10-10 NOTE — Progress Notes (Addendum)
 Subjective: Postpartum Day # 1 : S/P VBAC due to pt was admitted on 4/11 at 40.4 for elective IOL  TOLAC and progressed with pitocin and AROM to VBAC over lateral vaginal tear with repair, ebl was , hgb drop of 10.2-10.6. GBS+ with PCN 6 doses. H/O GAD no meds, mood stable. Patient up ad lib, denies syncope or dizziness. Reports consuming regular diet without issues and denies N/V. Patient reports 0 bowel movement + passing flatus.  Denies issues with urination and reports bleeding is "lighter."  Patient is breast and bottle feeding and reports going well.  Desires undecided possible mini pill  for postpartum contraception.  Pain is being appropriately managed with use of po meds. Pt complains of a cough, denies SOB.  Vaginal tear laceration Feeding:  Br/Bt Contraceptive plan:  mini pill BB: Circ in pt circ done today  Objective: Vital signs in last 24 hours: Patient Vitals for the past 24 hrs:  BP Temp Temp src Pulse Resp SpO2  10/10/23 0512 99/60 98.5 F (36.9 C) Oral 80 18 100 %  10/10/23 0015 123/85 97.8 F (36.6 C) Oral 88 18 100 %  10/09/23 2130 (!) 95/59 98.4 F (36.9 C) Oral 81 18 100 %  10/09/23 1744 114/79 99.1 F (37.3 C) Oral 74 18 99 %  10/09/23 1336 (!) 124/58 98.7 F (37.1 C) Oral 69 18 100 %  10/09/23 1226 126/75 98.5 F (36.9 C) Oral 73 18 100 %  10/09/23 1215 116/74 98.2 F (36.8 C) Oral 73 16 --  10/09/23 1146 102/75 -- -- 83 16 --  10/09/23 1135 120/68 -- -- 80 16 --  10/09/23 1116 110/63 -- -- 86 16 --  10/09/23 1114 (!) 95/37 -- -- (!) 127 17 --  10/09/23 1048 134/63 -- -- 96 16 --     Physical Exam:  General: alert, cooperative, and appears stated age Mood/Affect: happy Lungs: clear in most quadrants, wheezes noted, no rales or rhonchi, symmetric air entry.  Heart: normal rate, regular rhythm, normal S1, S2, no murmurs, rubs, clicks or gallops. Breast: breasts appear normal, no suspicious masses, no skin or nipple changes or axillary  nodes. Abdomen:  + bowel sounds, soft, non-tender GU: perineum intact, healing well. No signs of external hematomas.  Uterine Fundus: firm Lochia: appropriate Skin: Warm, Dry. DVT Evaluation: No evidence of DVT seen on physical exam. Negative Homan's sign. No cords or calf tenderness. No significant calf/ankle edema.     Latest Ref Rng & Units 10/08/2023   10:00 AM 09/16/2023    3:52 PM 07/29/2023   12:16 AM  CBC  WBC 4.0 - 10.5 K/uL 11.2  10.7  13.5   Hemoglobin 12.0 - 15.0 g/dL 57.8  46.9  62.9   Hematocrit 36.0 - 46.0 % 30.9  28.8  29.2   Platelets 150 - 400 K/uL 188  166  161     No results found for this or any previous visit (from the past 24 hours).   CBG (last 3)  No results for input(s): "GLUCAP" in the last 72 hours.   I/O last 3 completed shifts: In: -  Out: 1460 [Urine:1175; Blood:285]   Assessment Postpartum Day # 1 : S/P VBAC due to pt was admitted on 4/11 at 40.4 for elective IOL  TOLAC and progressed with pitocin and AROM to VBAC over lateral vaginal tear with repair, ebl was , hgb drop of 10.2-10.6. GBS+ with PCN 6 doses. H/O GAD no meds, mood stable. Pt stable. -  1 involution. Br/Bt feeding. Hemodynamically stable.   Plan: Continue other mgmt as ordered H/O GAD: Monitor mood.  Cough: mucinex 600mg  BID for cough, will monitor and do URI panel if indicated and chest xray if SOB, no febrile, VSS.  VTE prophylactics: Early ambulated as tolerates.  Pain control: Motrin/Tylenol PRN Education given regarding options for contraception, including barrier methods, injectable contraception, IUD placement, oral contraceptives.  Plan for discharge tomorrow, Breastfeeding, Lactation consult, and Contraception Mini Pill Dr. A.-Lott to be updated on patient status  Trevone Prestwood  CNM, FNP-C, PMHNP-BC  [id number]  [place], [place] [address]  Cell: [phone]  Office Phone: [phone] Fax: [phone] 10/10/2023  9:52 AM

## 2023-10-10 NOTE — Progress Notes (Signed)
 Writer called provider to get an order for an abdominal binder

## 2023-10-10 NOTE — Clinical Social Work Maternal (Signed)
 CLINICAL SOCIAL WORK MATERNAL/CHILD NOTE  Patient Details  Name: Ashley Romero MRN: [id number] Date of Birth: October 24, 1995  Date:  10/10/2023  Clinical Social Worker Initiating Note:  Eliazar Gross, LCSWA Date/Time: Initiated:  10/10/23/1438     Child's Name:  Ashley Romero, DOB: 10/09/20[birth year]   Biological Parents:  Mother (MOB: Ashley Romero, DOB:September 26, 1995)   Need for Interpreter:  None   Reason for Referral:  Behavioral Health Concerns   Address:  [address] [place] [address]    Phone number:  [phone] (home)     Additional phone number:   Household Members/Support Persons (HM/SP):   Household Member/Support Person 1   HM/SP Name Relationship DOB or Age  HM/SP -1 Ashley Romero Daughter 01/18/2016  HM/SP -2        HM/SP -3        HM/SP -4        HM/SP -5        HM/SP -6        HM/SP -7        HM/SP -8          Natural Supports (not living in the home):  Immediate Family, Friends   Herbalist: None   Employment: Unemployed   Type of Work:     Education:  Engineer, agricultural   Homebound arranged:    Surveyor, quantity Resources:  [employer], Media planner    Other Resources:  Sales executive  , [employer]   Cultural/Religious Considerations Which May Impact Care:    Strengths:  Ability to meet basic needs  , Home prepared for child  , Pediatrician chosen   Psychotropic Medications:         Pediatrician:    Armed forces operational officer area  Pediatrician List:   [facility]  [place]    [place]    [facility]      Pediatrician Fax Number:    Risk Factors/Current Problems:  Mental Health Concerns     Cognitive State:  Goal Oriented  , Alert  , Able to Concentrate  , Linear Thinking     Mood/Affect:  Calm  , Euthymic  , Comfortable  , Relaxed  , Interested     CSW Assessment: CSW was consulted due to history of anxiety, depression, Bipolar I Disorder,  history of suicide attempt, and central auditory processing disorder. CSW met with MOB at bedside to complete assessment. When CSW entered room, MOB was observed laying in hospital bed feeding infant. 2 visitors were present, who MOB identified as her mother and a friend. CSW introduced self and requested to speak with MOB alone. MOB provided verbal consent to continue with consult with visitors present. CSW explained reason for consult. MOB presented as calm, oriented x4, was agreeable to consult and remained engaged throughout encounter. MOB answered questions appropriately and was observed responding appropriately to infant's cues. MOB did not display any acute mental health symptoms during visit.  CSW inquired how MOB is feeling emotionally since the birth of infant. MOB reports feeling "okay." CSW inquired about MOB's mental health history. MOB acknowledged diagnoses of depression, anxiety, and Bipolar I Disorder, and reported she was diagnosed with all 3 diagnoses in Middle School. CSW inquired about mental health symptoms during pregnancy. MOB reports feeling "up and down" during pregnancy, marked by crying, sleeping a lot, eating more than usual, and feeling agitated. MOB reports she is not current with a  therapist and does not take mental health medications. MOB reports she has been prescribed medications for mental health in the past but she did not like how they made her feel sleepy. MOB reports she plans to initiate therapy and was agreeable to outpatient mental health resources, which CSW provided. MOB reports a history of postpartum depression after the birth of her daughter, marked by feeling "angry all the time and mood swings." MOB denied a history of postpartum psychosis. MOB recalled postpartum depression symptoms lasted about 2 years and she did not receive treatment. CSW inquired about past suicide attempt. MOB confirmed she attempted suicide in 2023 via intentional overdose of medication and  was hospitalized psychiatrically. MOB reports she was hospitalized inpatient on one other occasion at age 72 due to a Bipolar Disorder "episode." MOB denies endorsing suicidal ideation since 2023. MOB reports her last manic episode was 1 year ago. CSW inquired about coping skills. MOB identified baking and walking as healthy coping skills. MOB identified her mother and friend as supports. MOB denied current SI/HI.  CSW provided education regarding the baby blues period vs. perinatal mood disorders, discussed treatment and gave resources for mental health follow up if concerns arise.  CSW recommends self-evaluation during the postpartum time period using the New Mom Checklist from Postpartum Progress and encouraged MOB to contact a medical professional if symptoms are noted at any time.    MOB reports she has all needed items for infant, including a car seat and bassinet.   CSW provided review of Sudden Infant Death Syndrome (SIDS) precautions.    CSW identifies no further need for intervention and no barriers to discharge at this time.   CSW Plan/Description:  No Further Intervention Required/No Barriers to Discharge, Sudden Infant Death Syndrome (SIDS) Education, Perinatal Mood and Anxiety Disorder (PMADs) Education, Other Information/Referral to [employer] K Gustavus, LCSWA 10/10/2023, 2:43 PM

## 2023-10-10 NOTE — Progress Notes (Signed)
 Patient called out stating that she was seeing [facility] to calling nurse. States that she is no longer seeing stars . Denies any chest pain ,sob, numbness or tingling in her extremities . Patient blood pressure was 131/78 pulse 69 .Neuro assessment was wnl.Provider was notified. No new orders at this time.Will continue to monitor.

## 2023-10-11 ENCOUNTER — Inpatient Hospital Stay (HOSPITAL_COMMUNITY)

## 2023-10-11 MED ORDER — IBUPROFEN 600 MG PO TABS: 600.0000 mg | ORAL_TABLET | Freq: Four times a day (QID) | ORAL | 0 refills | Status: AC

## 2023-10-11 MED ORDER — OXYCODONE HCL 5 MG PO TABS: 5.0000 mg | ORAL_TABLET | ORAL | 0 refills | Status: AC | PRN

## 2023-10-11 MED ORDER — ACETAMINOPHEN 325 MG PO TABS: 650.0000 mg | ORAL_TABLET | ORAL | 0 refills | Status: AC | PRN

## 2023-10-11 MED ORDER — BENZOCAINE-MENTHOL 20-0.5 % EX AERO: 1.0000 | INHALATION_SPRAY | CUTANEOUS | 1 refills | Status: AC | PRN

## 2023-10-11 NOTE — Progress Notes (Signed)
 Patient denies having any other issues with her vision .Will continue to monitor.

## 2023-10-11 NOTE — Discharge Summary (Signed)
 Postpartum Discharge Summary  Date of Service updated4/14/25     Patient Name: Ashley Romero DOB: Aug 31, 1995 MRN: [id number]  Date of admission: 10/08/2023 Delivery date:10/09/2023 Delivering provider: Lavonda Jumbo Date of discharge: 10/11/2023  Admitting diagnosis: Encounter for elective induction of labor [Z34.90] Intrauterine pregnancy: [redacted]w[redacted]d     Secondary diagnosis:  Principal Problem:   Encounter for elective induction of labor Active Problems:   Normal postpartum course   IDA (iron deficiency anemia)   VBAC (vaginal birth after Cesarean)  Additional problems: VBAC    Discharge diagnosis: VBAC                                              Post partum procedures: NA Augmentation: AROM Complications: None  Hospital course: Induction of Labor With Vaginal Delivery   28 y.o. yo G2P2002 at [redacted]w[redacted]d was admitted to the hospital 10/08/2023 for induction of labor.  Indication for induction: Favorable cervix at term.  Patient had an labor course complicated byNOTHING Membrane Rupture Time/Date: 5:53 AM,10/09/2023  Delivery Method:VBAC, Spontaneous Operative Delivery:N/A Episiotomy: None Lacerations:  None Details of delivery can be found in separate delivery note.  Patient had a postpartum course complicated byNOTHING. Patient is discharged home 10/11/23.  Newborn Data: Birth date:10/09/2023 Birth time:10:42 AM Gender:Female Living status:Living Apgars:8 ,9  Weight:3280 g  Magnesium Sulfate received: No BMZ received: No Rhophylac:No MMR:No T-DaP:Given postpartum Flu: No RSV Vaccine received: No Transfusion:No Immunizations administered: Immunization History  Administered Date(s) Administered   DTaP 11/15/1996, 01/17/1997, 03/21/1997, 04/10/1998, 12/07/2000   HIB (PRP-OMP) 11/15/1996, 01/17/1997, 03/21/1997, 04/10/1998   HPV Quadrivalent 06/19/2010, 10/08/2010, 08/04/2011   Hepatitis A 12/21/2006, 01/17/2008   Hepatitis B 08/13/1996, 10/04/1996, 05/23/1997    IPV 11/15/1996, 01/17/1997, 03/21/1997, 04/10/1998, 12/07/2000   Influenza Nasal 06/21/2007, 05/01/2008, 03/19/2009, 05/07/2010, 03/15/2012   Influenza Split 04/04/2003, 04/21/2004, 04/21/2005, 08/04/2011   Influenza,Quad,Nasal, Live 03/30/2014   Influenza,inj,Quad PF,6+ Mos 06/10/2015   MMR 12/05/1997, 12/07/2000   Meningococcal Conjugate 01/17/2008, 10/24/2013   Td 01/17/2008   Tdap 01/17/2008, 01/21/2016   Varicella 09/05/1997, 12/21/2006    Physical exam  Vitals:   10/10/23 2317 10/11/23 0144 10/11/23 0229 10/11/23 0532  BP: 131/78 130/88 124/79 131/85  Pulse: 68 78 72 77  Resp: 18 18 18 18   Temp:    98.3 F (36.8 C)  TempSrc:    Oral  SpO2: 100% 100% 100% 100%  Weight:      Height:       General: alert and cooperative Lochia: appropriate Uterine Fundus: firm Incision: N/A DVT Evaluation: No evidence of DVT seen on physical exam. Labs: Lab Results  Component Value Date   WBC 11.2 (H) 10/08/2023   HGB 10.6 (L) 10/08/2023   HCT 30.9 (L) 10/08/2023   MCV 86.6 10/08/2023   PLT 188 10/08/2023      Latest Ref Rng & Units 09/16/2023    3:52 PM  CMP  Glucose 70 - 99 mg/dL 409   BUN 6 - 20 mg/dL <5   Creatinine 8.11 - 1.00 mg/dL 9.14   Sodium 782 - 956 mmol/L 135   Potassium 3.5 - 5.1 mmol/L 3.1   Chloride 98 - 111 mmol/L 106   CO2 22 - 32 mmol/L 19   Calcium 8.9 - 10.3 mg/dL 8.5   Total Protein 6.5 - 8.1 g/dL 6.1   Total Bilirubin 0.0 - 1.2 mg/dL 0.5  Alkaline Phos 38 - 126 U/L 115   AST 15 - 41 U/L 17   ALT 0 - 44 U/L 11    Edinburgh Score:    10/10/2023   11:00 AM  Edinburgh Postnatal Depression Scale Screening Tool  I have been able to laugh and see the funny side of things. 0  I have looked forward with enjoyment to things. 0  I have blamed myself unnecessarily when things went wrong. 1  I have been anxious or worried for no good reason. 0  I have felt scared or panicky for no good reason. 0  Things have been getting on top of me. 1  I have been so  unhappy that I have had difficulty sleeping. 0  I have felt sad or miserable. 0  I have been so unhappy that I have been crying. 1  The thought of harming myself has occurred to me. 0  Edinburgh Postnatal Depression Scale Total 3      After visit meds:  Allergies as of 10/11/2023   No Known Allergies      Medication List     STOP taking these medications    cyclobenzaprine 10 MG tablet Commonly known as: FLEXERIL   potassium chloride SA 20 MEQ tablet Commonly known as: KLOR-CON M   prenatal multivitamin Tabs tablet       TAKE these medications    acetaminophen 325 MG tablet Commonly known as: Tylenol Take 2 tablets (650 mg total) by mouth every 4 (four) hours as needed (for pain scale < 4).   benzocaine-Menthol 20-0.5 % Aero Commonly known as: DERMOPLAST Apply 1 Application topically as needed for irritation (perineal discomfort).   hydrOXYzine 10 MG tablet Commonly known as: ATARAX Take 10-20 mg by mouth at bedtime. Pt not taking. No needed   ibuprofen 600 MG tablet Commonly known as: ADVIL Take 1 tablet (600 mg total) by mouth every 6 (six) hours.   Iron 325 (65 Fe) MG Tabs Take 1 tablet (325 mg total) by mouth every other day.   oxyCODONE 5 MG immediate release tablet Commonly known as: Oxy IR/ROXICODONE Take 1 tablet (5 mg total) by mouth every 4 (four) hours as needed for moderate pain (pain score 4-6).   valACYclovir 1000 MG tablet Commonly known as: VALTREX Take 1,000 mg by mouth daily.         Discharge home in stable condition Infant Feeding: Breast Infant Disposition:home with mother Discharge instruction: per After Visit Summary and Postpartum booklet. Activity: Advance as tolerated. Pelvic rest for 6 weeks.  Diet: routine diet Anticipated Birth Control: OCPs and Unsure Postpartum Appointment:6 weeks Additional Postpartum F/U:    Future Appointments:No future appointments. Follow up Visit:  Follow-up Information     [facility]. Schedule an appointment as soon as possible for a visit in 2 week(s).   Specialty: Obstetrics and Gynecology Why: 2 week mood check and 6 weeks PPV Contact information: [address] [place]  [address] [phone]                    10/11/2023 Norville Beery, MD

## 2023-10-11 NOTE — Lactation Note (Signed)
 This note was copied from a baby's chart. Lactation Consultation Note  Patient Name: Ashley Romero [id number] Date: 10/11/2023 Age:27 hours Reason for consult: Follow-up assessment;Term;Maternal endocrine disorder  P2, Baby is primarily formula feeding. Mother states her milk has not come in yet. Provided education and reviewed supply and demand. Reviewed engorgement care and monitoring voids/stools. Mother does not have questions or concerns at this time.  Maternal Data Has patient been taught Hand Expression?: Yes  Feeding Mother's Current Feeding Choice: Breast Milk and Formula Nipple Type: Slow - flow  Interventions Interventions: Education  Discharge Discharge Education: Engorgement and breast care;Warning signs for feeding baby Pump: DEBP  Consult Status Consult Status: Complete Date: 10/11/23    [name] [facility] 10/11/2023, 9:04 AM

## 2023-10-11 NOTE — Care Management Important Message (Signed)
 Important Message  Patient Details  Name: Ashley Romero MRN: [id number] Date of Birth: 10/23/1995   Important Message Given:        [name] 10/11/2023, 8:50 AM

## 2023-10-11 NOTE — Lactation Note (Signed)
 This note was copied from a baby's chart. Lactation Consultation Note  Patient Name: Ashley Romero [id number] Date: 10/11/2023 Age:27 hours  Lc thought mom would be up by now but everyone in rm. Sleeping.   Maternal Data    Feeding Nipple Type: Slow - flow  LATCH Score                    Lactation Tools Discussed/Used    Interventions    Discharge    Consult Status      Ashley Romero G 10/11/2023, 6:29 AM

## 2023-10-19 ENCOUNTER — Telehealth (HOSPITAL_COMMUNITY): Payer: Self-pay | Admitting: *Deleted

## 2023-10-19 NOTE — Telephone Encounter (Signed)
 10/19/2023  Name: Ashley Romero MRN: [id number] DOB: [birth year]  Reason for Call:  Transition of Care Hospital Discharge Call  Contact Status: Patient Contact Status: Message  Language assistant needed:          Follow-Up Questions:    Dimple Francis Postnatal Depression Scale:  In the Past 7 Days:    PHQ2-9 Depression Scale:     Discharge Follow-up:    Post-discharge interventions: NA  Pearlie Bougie, RN 10/19/2023 15:03

## 2024-03-09 ENCOUNTER — Encounter: Payer: Self-pay | Admitting: Obstetrics & Gynecology
